# Patient Record
Sex: Male | Born: 1935 | Race: White | Hispanic: No | Marital: Married | Smoking: Former smoker
Health system: Southern US, Community
[De-identification: ages and names within clinical notes are randomized; demographics above are authoritative.]

## PROBLEM LIST (undated history)

## (undated) DIAGNOSIS — I509 Heart failure, unspecified: Secondary | ICD-10-CM

## (undated) DIAGNOSIS — I10 Essential (primary) hypertension: Secondary | ICD-10-CM

## (undated) DIAGNOSIS — I2510 Atherosclerotic heart disease of native coronary artery without angina pectoris: Secondary | ICD-10-CM

## (undated) DIAGNOSIS — E119 Type 2 diabetes mellitus without complications: Secondary | ICD-10-CM

## (undated) DIAGNOSIS — E785 Hyperlipidemia, unspecified: Secondary | ICD-10-CM

## (undated) DIAGNOSIS — I4891 Unspecified atrial fibrillation: Secondary | ICD-10-CM

## (undated) DIAGNOSIS — K219 Gastro-esophageal reflux disease without esophagitis: Secondary | ICD-10-CM

## (undated) DIAGNOSIS — C801 Malignant (primary) neoplasm, unspecified: Secondary | ICD-10-CM

## (undated) DIAGNOSIS — J449 Chronic obstructive pulmonary disease, unspecified: Secondary | ICD-10-CM

## (undated) HISTORY — PX: PROSTATE SURGERY: SHX751

## (undated) HISTORY — DX: Unspecified atrial fibrillation: I48.91

## (undated) HISTORY — PX: STENT PLACEMENT VASCULAR (ARMC HX): HXRAD1737

## (undated) HISTORY — DX: Heart failure, unspecified: I50.9

## (undated) HISTORY — PX: TONSILLECTOMY: SUR1361

---

## 2005-06-11 ENCOUNTER — Ambulatory Visit: Payer: Self-pay | Admitting: Unknown Physician Specialty

## 2010-02-25 DIAGNOSIS — D72829 Elevated white blood cell count, unspecified: Secondary | ICD-10-CM | POA: Insufficient documentation

## 2011-09-25 ENCOUNTER — Ambulatory Visit: Payer: Self-pay | Admitting: Urology

## 2012-12-02 ENCOUNTER — Ambulatory Visit: Payer: Self-pay | Admitting: Ophthalmology

## 2014-03-09 DIAGNOSIS — I10 Essential (primary) hypertension: Secondary | ICD-10-CM | POA: Insufficient documentation

## 2014-03-09 DIAGNOSIS — E785 Hyperlipidemia, unspecified: Secondary | ICD-10-CM | POA: Insufficient documentation

## 2014-09-14 DIAGNOSIS — Z8679 Personal history of other diseases of the circulatory system: Secondary | ICD-10-CM | POA: Insufficient documentation

## 2014-09-14 DIAGNOSIS — Z8711 Personal history of peptic ulcer disease: Secondary | ICD-10-CM | POA: Insufficient documentation

## 2016-05-19 ENCOUNTER — Emergency Department: Payer: Medicare Other

## 2016-05-19 ENCOUNTER — Inpatient Hospital Stay
Admission: EM | Admit: 2016-05-19 | Discharge: 2016-05-23 | DRG: 190 | Disposition: A | Discharge status: Home Health | Payer: Medicare Other | Attending: Internal Medicine | Admitting: Internal Medicine

## 2016-05-19 DIAGNOSIS — I2510 Atherosclerotic heart disease of native coronary artery without angina pectoris: Secondary | ICD-10-CM | POA: Diagnosis present

## 2016-05-19 DIAGNOSIS — J440 Chronic obstructive pulmonary disease with acute lower respiratory infection: Secondary | ICD-10-CM | POA: Diagnosis present

## 2016-05-19 DIAGNOSIS — I472 Ventricular tachycardia, unspecified: Secondary | ICD-10-CM

## 2016-05-19 DIAGNOSIS — Z79899 Other long term (current) drug therapy: Secondary | ICD-10-CM

## 2016-05-19 DIAGNOSIS — T380X5A Adverse effect of glucocorticoids and synthetic analogues, initial encounter: Secondary | ICD-10-CM | POA: Diagnosis present

## 2016-05-19 DIAGNOSIS — I493 Ventricular premature depolarization: Secondary | ICD-10-CM | POA: Diagnosis present

## 2016-05-19 DIAGNOSIS — Z7984 Long term (current) use of oral hypoglycemic drugs: Secondary | ICD-10-CM | POA: Diagnosis not present

## 2016-05-19 DIAGNOSIS — N289 Disorder of kidney and ureter, unspecified: Secondary | ICD-10-CM | POA: Diagnosis present

## 2016-05-19 DIAGNOSIS — E785 Hyperlipidemia, unspecified: Secondary | ICD-10-CM | POA: Diagnosis present

## 2016-05-19 DIAGNOSIS — Z7982 Long term (current) use of aspirin: Secondary | ICD-10-CM

## 2016-05-19 DIAGNOSIS — J9621 Acute and chronic respiratory failure with hypoxia: Secondary | ICD-10-CM | POA: Diagnosis present

## 2016-05-19 DIAGNOSIS — A419 Sepsis, unspecified organism: Secondary | ICD-10-CM

## 2016-05-19 DIAGNOSIS — J189 Pneumonia, unspecified organism: Secondary | ICD-10-CM | POA: Diagnosis present

## 2016-05-19 DIAGNOSIS — I10 Essential (primary) hypertension: Secondary | ICD-10-CM | POA: Diagnosis present

## 2016-05-19 DIAGNOSIS — J441 Chronic obstructive pulmonary disease with (acute) exacerbation: Secondary | ICD-10-CM | POA: Diagnosis present

## 2016-05-19 DIAGNOSIS — Z7901 Long term (current) use of anticoagulants: Secondary | ICD-10-CM

## 2016-05-19 DIAGNOSIS — E1165 Type 2 diabetes mellitus with hyperglycemia: Secondary | ICD-10-CM | POA: Diagnosis present

## 2016-05-19 DIAGNOSIS — I248 Other forms of acute ischemic heart disease: Secondary | ICD-10-CM | POA: Diagnosis present

## 2016-05-19 DIAGNOSIS — Z87891 Personal history of nicotine dependence: Secondary | ICD-10-CM | POA: Diagnosis not present

## 2016-05-19 DIAGNOSIS — I482 Chronic atrial fibrillation: Secondary | ICD-10-CM | POA: Diagnosis present

## 2016-05-19 DIAGNOSIS — I4891 Unspecified atrial fibrillation: Secondary | ICD-10-CM

## 2016-05-19 DIAGNOSIS — E876 Hypokalemia: Secondary | ICD-10-CM | POA: Diagnosis present

## 2016-05-19 DIAGNOSIS — R000 Tachycardia, unspecified: Secondary | ICD-10-CM

## 2016-05-19 HISTORY — DX: Essential (primary) hypertension: I10

## 2016-05-19 HISTORY — DX: Type 2 diabetes mellitus without complications: E11.9

## 2016-05-19 HISTORY — DX: Atherosclerotic heart disease of native coronary artery without angina pectoris: I25.10

## 2016-05-19 HISTORY — DX: Hyperlipidemia, unspecified: E78.5

## 2016-05-19 HISTORY — DX: Malignant (primary) neoplasm, unspecified: C80.1

## 2016-05-19 LAB — BASIC METABOLIC PANEL WITH GFR
Anion gap: 8 (ref 5–15)
BUN: 27 mg/dL — ABNORMAL HIGH (ref 6–20)
CO2: 32 mmol/L (ref 22–32)
Calcium: 9 mg/dL (ref 8.9–10.3)
Chloride: 98 mmol/L — ABNORMAL LOW (ref 101–111)
Creatinine, Ser: 1.32 mg/dL — ABNORMAL HIGH (ref 0.61–1.24)
GFR calc Af Amer: 55 mL/min — ABNORMAL LOW (ref >60)
GFR calc non Af Amer: 47 mL/min — ABNORMAL LOW (ref >60)
Glucose, Bld: 322 mg/dL — ABNORMAL HIGH (ref 65–99)
Potassium: 3.4 mmol/L — ABNORMAL LOW (ref 3.5–5.1)
Sodium: 138 mmol/L (ref 135–145)

## 2016-05-19 LAB — TROPONIN I
TROPONIN I: 0.07 ng/mL — AB (ref <0.03)
Troponin I: 0.06 ng/mL (ref <0.03)

## 2016-05-19 LAB — CBC WITH DIFFERENTIAL/PLATELET
Basophils Absolute: 0 10*3/uL (ref 0–0.1)
Basophils Relative: 0 %
Eosinophils Absolute: 0.1 10*3/uL (ref 0–0.7)
Eosinophils Relative: 1 %
HEMATOCRIT: 43.6 % (ref 40.0–52.0)
HEMOGLOBIN: 15.4 g/dL (ref 13.0–18.0)
LYMPHS ABS: 1.2 10*3/uL (ref 1.0–3.6)
Lymphocytes Relative: 9 %
MCH: 31.1 pg (ref 26.0–34.0)
MCHC: 35.2 g/dL (ref 32.0–36.0)
MCV: 88.3 fL (ref 80.0–100.0)
MONOS PCT: 8 %
Monocytes Absolute: 1.1 10*3/uL — ABNORMAL HIGH (ref 0.2–1.0)
NEUTROS ABS: 11.1 10*3/uL — AB (ref 1.4–6.5)
NEUTROS PCT: 82 %
Platelets: 334 10*3/uL (ref 150–440)
RBC: 4.94 MIL/uL (ref 4.40–5.90)
RDW: 13.7 % (ref 11.5–14.5)
WBC: 13.6 10*3/uL — AB (ref 3.8–10.6)

## 2016-05-19 LAB — GLUCOSE, CAPILLARY
Glucose-Capillary: 426 mg/dL — ABNORMAL HIGH (ref 65–99)
Glucose-Capillary: 411 mg/dL — ABNORMAL HIGH (ref 65–99)

## 2016-05-19 LAB — PROTIME-INR
INR: 3.65
Prothrombin Time: 37.2 s — ABNORMAL HIGH (ref 11.4–15.2)

## 2016-05-19 LAB — LACTIC ACID, PLASMA: Lactic Acid, Venous: 1.8 mmol/L (ref 0.5–1.9)

## 2016-05-19 MED ORDER — LATANOPROST 0.005 % OP SOLN
1.0000 [drp] | Freq: Every day | OPHTHALMIC | Status: DC
Start: 2016-05-19 — End: 2016-05-23
  Administered 2016-05-19 – 2016-05-22 (×4): 1 [drp] via OPHTHALMIC
  Filled 2016-05-19 (×2): qty 2.5

## 2016-05-19 MED ORDER — PREDNISONE 20 MG PO TABS
40.0000 mg | ORAL_TABLET | Freq: Once | ORAL | Status: AC
  Administered 2016-05-19: 40 mg via ORAL
  Filled 2016-05-19: qty 2

## 2016-05-19 MED ORDER — DEXTROSE 5 % IV SOLN
1.0000 g | INTRAVENOUS | Status: DC
  Administered 2016-05-20 – 2016-05-23 (×4): 1 g via INTRAVENOUS
  Filled 2016-05-19 (×4): qty 10

## 2016-05-19 MED ORDER — METOPROLOL TARTRATE 25 MG PO TABS
25.0000 mg | ORAL_TABLET | Freq: Four times a day (QID) | ORAL | Status: DC
  Administered 2016-05-19 – 2016-05-23 (×14): 25 mg via ORAL
  Filled 2016-05-19 (×14): qty 1

## 2016-05-19 MED ORDER — INSULIN ASPART 100 UNIT/ML ~~LOC~~ SOLN
0.0000 [IU] | Freq: Three times a day (TID) | SUBCUTANEOUS | Status: DC
Start: 2016-05-20 — End: 2016-05-19

## 2016-05-19 MED ORDER — DEXTROSE 5 % IV SOLN
500.0000 mg | INTRAVENOUS | Status: DC
  Administered 2016-05-20 – 2016-05-22 (×3): 500 mg via INTRAVENOUS
  Filled 2016-05-19 (×3): qty 500

## 2016-05-19 MED ORDER — HYDROCHLOROTHIAZIDE 25 MG PO TABS
25.0000 mg | ORAL_TABLET | Freq: Every day | ORAL | Status: DC
  Administered 2016-05-20 – 2016-05-23 (×4): 25 mg via ORAL
  Filled 2016-05-19 (×4): qty 1

## 2016-05-19 MED ORDER — AMLODIPINE BESYLATE 5 MG PO TABS
5.0000 mg | ORAL_TABLET | Freq: Every day | ORAL | Status: DC
  Administered 2016-05-20 – 2016-05-23 (×4): 5 mg via ORAL
  Filled 2016-05-19 (×4): qty 1

## 2016-05-19 MED ORDER — INSULIN ASPART 100 UNIT/ML ~~LOC~~ SOLN
0.0000 [IU] | Freq: Three times a day (TID) | SUBCUTANEOUS | Status: DC
  Administered 2016-05-20 (×3): 20 [IU] via SUBCUTANEOUS
  Administered 2016-05-21 (×2): 11 [IU] via SUBCUTANEOUS
  Administered 2016-05-21: 7 [IU] via SUBCUTANEOUS
  Administered 2016-05-22: 3 [IU] via SUBCUTANEOUS
  Administered 2016-05-22 (×2): 11 [IU] via SUBCUTANEOUS
  Filled 2016-05-19 (×3): qty 11
  Filled 2016-05-19: qty 7
  Filled 2016-05-19 (×2): qty 20
  Filled 2016-05-19: qty 3
  Filled 2016-05-19: qty 20

## 2016-05-19 MED ORDER — ACETAMINOPHEN 325 MG PO TABS
650.0000 mg | ORAL_TABLET | Freq: Four times a day (QID) | ORAL | Status: DC | PRN
  Administered 2016-05-19: 650 mg via ORAL
  Filled 2016-05-19: qty 1

## 2016-05-19 MED ORDER — DEXTROSE 5 % IV SOLN: 1.0000 g | INTRAVENOUS | Status: DC

## 2016-05-19 MED ORDER — SODIUM CHLORIDE 0.9 % IV BOLUS (SEPSIS)
1000.0000 mL | Freq: Once | INTRAVENOUS | Status: AC
  Administered 2016-05-19: 1000 mL via INTRAVENOUS

## 2016-05-19 MED ORDER — METHYLPREDNISOLONE SODIUM SUCC 125 MG IJ SOLR
60.0000 mg | Freq: Four times a day (QID) | INTRAMUSCULAR | Status: DC
  Administered 2016-05-19 – 2016-05-20 (×3): 60 mg via INTRAVENOUS
  Filled 2016-05-19 (×3): qty 2

## 2016-05-19 MED ORDER — ALBUTEROL SULFATE (2.5 MG/3ML) 0.083% IN NEBU
2.5000 mg | INHALATION_SOLUTION | RESPIRATORY_TRACT | Status: DC | PRN
  Filled 2016-05-19: qty 3

## 2016-05-19 MED ORDER — ONDANSETRON HCL 4 MG/2ML IJ SOLN: 4.0000 mg | Freq: Four times a day (QID) | INTRAMUSCULAR | Status: DC | PRN

## 2016-05-19 MED ORDER — HYDROCOD POLST-CPM POLST ER 10-8 MG/5ML PO SUER: 5.0000 mL | Freq: Two times a day (BID) | ORAL | Status: DC | PRN

## 2016-05-19 MED ORDER — DEXTROSE 5 % IV SOLN
1.0000 g | Freq: Once | INTRAVENOUS | Status: AC
  Administered 2016-05-19: 1 g via INTRAVENOUS
  Filled 2016-05-19: qty 10

## 2016-05-19 MED ORDER — DOCUSATE SODIUM 100 MG PO CAPS
100.0000 mg | ORAL_CAPSULE | Freq: Two times a day (BID) | ORAL | Status: DC
  Administered 2016-05-19 – 2016-05-23 (×8): 100 mg via ORAL
  Filled 2016-05-19 (×8): qty 1

## 2016-05-19 MED ORDER — IPRATROPIUM-ALBUTEROL 0.5-2.5 (3) MG/3ML IN SOLN
3.0000 mL | Freq: Once | RESPIRATORY_TRACT | Status: AC
  Administered 2016-05-19: 3 mL via RESPIRATORY_TRACT
  Filled 2016-05-19: qty 3

## 2016-05-19 MED ORDER — IPRATROPIUM-ALBUTEROL 0.5-2.5 (3) MG/3ML IN SOLN
3.0000 mL | Freq: Four times a day (QID) | RESPIRATORY_TRACT | Status: DC
  Administered 2016-05-19 – 2016-05-22 (×10): 3 mL via RESPIRATORY_TRACT
  Filled 2016-05-19 (×11): qty 3

## 2016-05-19 MED ORDER — ATORVASTATIN CALCIUM 20 MG PO TABS
40.0000 mg | ORAL_TABLET | Freq: Every day | ORAL | Status: DC
  Administered 2016-05-20 – 2016-05-23 (×4): 40 mg via ORAL
  Filled 2016-05-19 (×4): qty 2

## 2016-05-19 MED ORDER — WARFARIN SODIUM 5 MG PO TABS: 5.0000 mg | ORAL_TABLET | Freq: Every evening | ORAL | Status: DC

## 2016-05-19 MED ORDER — ACETAMINOPHEN 650 MG RE SUPP: 650.0000 mg | Freq: Four times a day (QID) | RECTAL | Status: DC | PRN

## 2016-05-19 MED ORDER — INSULIN ASPART 100 UNIT/ML ~~LOC~~ SOLN
0.0000 [IU] | Freq: Every day | SUBCUTANEOUS | Status: DC
  Administered 2016-05-19: 7 [IU] via SUBCUTANEOUS
  Administered 2016-05-20: 3 [IU] via SUBCUTANEOUS
  Administered 2016-05-21: 2 [IU] via SUBCUTANEOUS
  Filled 2016-05-19: qty 3
  Filled 2016-05-19: qty 7
  Filled 2016-05-19: qty 3

## 2016-05-19 MED ORDER — DEXTROSE 5 % IV SOLN: 500.0000 mg | INTRAVENOUS | Status: DC

## 2016-05-19 MED ORDER — METFORMIN HCL 500 MG PO TABS
500.0000 mg | ORAL_TABLET | Freq: Two times a day (BID) | ORAL | Status: DC
  Administered 2016-05-20 – 2016-05-23 (×7): 500 mg via ORAL
  Filled 2016-05-19 (×7): qty 1

## 2016-05-19 MED ORDER — LORAZEPAM 2 MG/ML IJ SOLN: 0.5000 mg | INTRAMUSCULAR | Status: DC | PRN

## 2016-05-19 MED ORDER — BISACODYL 10 MG RE SUPP: 10.0000 mg | Freq: Every day | RECTAL | Status: DC | PRN

## 2016-05-19 MED ORDER — FAMOTIDINE IN NACL 20-0.9 MG/50ML-% IV SOLN
20.0000 mg | Freq: Every day | INTRAVENOUS | Status: DC
  Administered 2016-05-19: 20 mg via INTRAVENOUS
  Filled 2016-05-19: qty 50

## 2016-05-19 MED ORDER — POTASSIUM CHLORIDE IN NACL 20-0.9 MEQ/L-% IV SOLN
INTRAVENOUS | Status: DC
  Administered 2016-05-19: 23:00:00 via INTRAVENOUS
  Filled 2016-05-19 (×4): qty 1000

## 2016-05-19 MED ORDER — SODIUM CHLORIDE 0.9% FLUSH
3.0000 mL | Freq: Two times a day (BID) | INTRAVENOUS | Status: DC
  Administered 2016-05-19 – 2016-05-22 (×7): 3 mL via INTRAVENOUS

## 2016-05-19 MED ORDER — DIPHENHYDRAMINE HCL 25 MG PO CAPS
25.0000 mg | ORAL_CAPSULE | Freq: Every evening | ORAL | Status: DC | PRN
  Administered 2016-05-19: 25 mg via ORAL
  Filled 2016-05-19: qty 1

## 2016-05-19 MED ORDER — TRAZODONE HCL 50 MG PO TABS
50.0000 mg | ORAL_TABLET | Freq: Every day | ORAL | Status: DC
  Administered 2016-05-19 – 2016-05-22 (×4): 50 mg via ORAL
  Filled 2016-05-19 (×4): qty 1

## 2016-05-19 MED ORDER — ASPIRIN EC 81 MG PO TBEC
81.0000 mg | DELAYED_RELEASE_TABLET | Freq: Every day | ORAL | Status: DC
  Administered 2016-05-20 – 2016-05-23 (×4): 81 mg via ORAL
  Filled 2016-05-19 (×4): qty 1

## 2016-05-19 MED ORDER — ONDANSETRON HCL 4 MG PO TABS: 4.0000 mg | ORAL_TABLET | Freq: Four times a day (QID) | ORAL | Status: DC | PRN

## 2016-05-19 MED ORDER — DEXTROSE 5 % IV SOLN
500.0000 mg | Freq: Once | INTRAVENOUS | Status: AC
  Administered 2016-05-19: 500 mg via INTRAVENOUS
  Filled 2016-05-19: qty 500

## 2016-05-19 MED ORDER — NITROGLYCERIN 2 % TD OINT
1.0000 [in_us] | TOPICAL_OINTMENT | Freq: Four times a day (QID) | TRANSDERMAL | Status: DC
  Administered 2016-05-19 – 2016-05-20 (×2): 1 [in_us] via TOPICAL
  Filled 2016-05-19 (×3): qty 1

## 2016-05-19 MED ORDER — PANTOPRAZOLE SODIUM 40 MG PO TBEC
40.0000 mg | DELAYED_RELEASE_TABLET | Freq: Every day | ORAL | Status: DC
  Administered 2016-05-20 – 2016-05-23 (×5): 40 mg via ORAL
  Filled 2016-05-19 (×4): qty 1

## 2016-05-19 NOTE — ED Triage Notes (Signed)
Oxygen sats 88% on RA and HR 121

## 2016-05-19 NOTE — ED Provider Notes (Signed)
[facility] Emergency Department Provider Note   ____________________________________________   First MD Initiated Contact with Patient 05/19/16 1507     (approximate)  I have reviewed the triage vital signs and the nursing notes.   HISTORY  Chief Complaint Cough    HPI Alan Barrett is a 82 y.o. male and coughing for one week. Reports short of breath. No fever but feeling chilled. Occasionally wheezing. No history of COPD or emphysema.He  Went to urgent care, oxygen level is low at 87%. He received one nebulizer there with minimal improvement.  No pain. History of an empyema required surgical drainage about 5 years ago in [place].   Past Medical History:  Diagnosis Date  . Cancer ([place])   . Diabetes mellitus without complication ([place])   . Hyperlipemia   . Hypertension     There are no active problems to display for this patient.   Past Surgical History:  Procedure Laterality Date  . LUNG SURGERY    . STENT PLACEMENT VASCULAR ([place] HX)      Prior to Admission medications   Not on File    Allergies Review of patient's allergies indicates no known allergies.  No family history on file.  Social History Social History  Substance Use Topics  . Smoking status: Former Research scientist (life sciences)  . Smokeless tobacco: Never Used  . Alcohol use Not on file    Review of Systems Constitutional: No fever/ Eyes: No visual changes. ENT: No sore throat. Cardiovascular: Denies chest pain. Respiratory: See history of present illness Gastrointestinal: No abdominal pain.  No nausea, no vomiting.  No diarrhea.  No constipation. Genitourinary: Negative for dysuria. Musculoskeletal: Negative for back pain. Skin: Negative for rash. Neurological: Negative for headaches, focal weakness or numbness.  10-point ROS otherwise negative.  ____________________________________________   PHYSICAL EXAM:  VITAL SIGNS: ED Triage Vitals  Enc Vitals Group     BP  05/19/16 1411 (!) 153/64     Pulse Rate 05/19/16 1411 (!) 120     Resp 05/19/16 1411 20     Temp 05/19/16 1411 98.1 F (36.7 C)     Temp Source 05/19/16 1411 Oral     SpO2 05/19/16 1411 (!) 89 %     Weight 05/19/16 1411 200 lb (90.7 kg)     Height 05/19/16 1411 5\' 8"  (1.727 m)     Head Circumference --      Peak Flow --      Pain Score 05/19/16 1412 0     Pain Loc --      Pain Edu? --      Excl. in [place]? --     Constitutional: Alert and oriented. Well appearing and in no acute distress.Mildly dyspneic Eyes: Conjunctivae are normal. PERRL. EOMI. Head: Atraumatic. Nose: No congestion/rhinnorhea. Mouth/Throat: Mucous membranes are moist.  Oropharynx non-erythematous. Neck: No stridor.   Cardiovascular: Tachycardic rate, regular rhythm. Grossly normal heart sounds.  Good peripheral circulation. Respiratory: Mild tachypnea. Mild and expiratory wheezing. Minimal focal rails versus dry crackles for the left lung base. Patient speaks in moderate phrases. Gastrointestinal: Soft and nontender. No distention. No abdominal bruits. No CVA tenderness. Musculoskeletal: No lower extremity tenderness nor edema.  No joint effusions. Neurologic:  Normal speech and language. No gross focal neurologic deficits are appreciated. No gait instability. Skin:  Skin is warm, dry and intact. No rash noted. Psychiatric: Mood and affect are normal. Speech and behavior are normal.  ____________________________________________   LABS (all labs ordered are listed, but  only abnormal results are displayed)  Labs Reviewed  CBC WITH DIFFERENTIAL/PLATELET - Abnormal; Notable for the following:       Result Value   WBC 13.6 (*)    Neutro Abs 11.1 (*)    Monocytes Absolute 1.1 (*)    All other components within normal limits  BASIC METABOLIC PANEL - Abnormal; Notable for the following:    Potassium 3.4 (*)    Chloride 98 (*)    Glucose, Bld 322 (*)    BUN 27 (*)    Creatinine, Ser 1.32 (*)    GFR calc non Af  Amer 47 (*)    GFR calc Af Amer 55 (*)    All other components within normal limits  TROPONIN I - Abnormal; Notable for the following:    Troponin I 0.07 (*)    All other components within normal limits  CULTURE, BLOOD (ROUTINE X 2)  CULTURE, BLOOD (ROUTINE X 2)  URINE CULTURE  LACTIC ACID, PLASMA  LACTIC ACID, PLASMA  PROTIME-INR   ____________________________________________  EKG  Reviewed her on the 1425 Heart rate 110 QRS 9 QTc 4:30 Normal sinus rhythm felt likely, but occasional ectopic atrial beats may be present, left axis deviation No evidence of acute ST elevation MI ____________________________________________  RADIOLOGY  Dg Chest 2 View  Result Date: 05/19/2016 CLINICAL DATA:  82 year old male with shortness of breath and 1 week history of cough EXAM: CHEST  2 VIEW COMPARISON:  None. FINDINGS: Patchy lingular opacity partially obscuring the left heart margin on the frontal view. No pleural effusion, pulmonary edema or pneumothorax. The cardiac and mediastinal contours are within normal limits. Diffuse bronchial wall thickening and mild interstitial prominence are likely chronic. No acute osseous abnormality. IMPRESSION: 1. Nonspecific patchy airspace opacity in the lingula may reflect early bronchopneumonia or a region of atelectasis/scarring. Followup PA and lateral chest X-ray is recommended in 3-4 weeks following trial of antibiotic therapy to ensure resolution and exclude underlying malignancy. 2.  Aortic Atherosclerosis (ICD10-170.0) 3. Mild hyperinflation, interstitial prominence and central airway thickening suggests underlying COPD. Electronically Signed   By: Jacqulynn Cadet M.D.   On: 05/19/2016 14:59    ____________________________________________   PROCEDURES  Procedure(s) performed: None  Procedures  Critical Care performed: Yes, see critical care note(s)  ____________________________________________   INITIAL IMPRESSION / ASSESSMENT AND PLAN /  ED COURSE  Pertinent labs & imaging results that were available during my care of the patient were reviewed by me and considered in my medical decision making (see chart for details).  Patient presents with one-week cough come wheezing, no previous history of COPD or emphysema. Appears likely bronchitis, however on chest x-ray there is questionable infiltrate in the lingula and given his ongoing cough with associated tachycardia and leukocytosis felt possible mild sepsis with associated pneumonia. He does a history of a previous empyema with left-sided chest tube placement, this could represent scarring as well.  Because of the patient's hemodynamic status, mild persistent tachycardia, landing to admit him to the hospital for broad-spectrum antibiotics and further care via internal medicine service. No evidence of acute coronary syndrome.  Clinical Course     ____________________________________________   FINAL CLINICAL IMPRESSION(S) / ED DIAGNOSES  Final diagnoses:  Sepsis, due to unspecified organism [facility])  Community acquired pneumonia      NEW MEDICATIONS STARTED DURING THIS VISIT:  New Prescriptions   No medications on file     Note:  This document was prepared using Dragon voice recognition software and may include  unintentional dictation errors.     Delman Kitten, MD 05/19/16 [phone]

## 2016-05-19 NOTE — H&P (Signed)
History and Physical    Alan Barrett [id number] DOB: [birth year] DOA: 05/19/2016  Referring physician: Dr. Jacqualine Code PCP: Madelyn Brunner, MD  Specialists: none  Chief Complaint: SOB and cough  HPI: Alan Barrett is a 82 y.o. male has a past medical history significant for ASCVD, chronic a-fib on coumadin and DM now with 1 week hx of worsening SOB and cough. No fever. Denies CP. Cough is non-productive. Hypoxic in ER. CXR reveals left lingular pneumonia. In rapid A-fib and troponin slightly elevated. He is now admitted.  Review of Systems: The patient denies anorexia, fever, weight loss,, vision loss, decreased hearing, hoarseness, chest pain, syncope,  peripheral edema, balance deficits, hemoptysis, abdominal pain, melena, hematochezia, severe indigestion/heartburn, hematuria, incontinence, genital sores, muscle weakness, suspicious skin lesions, transient blindness, difficulty walking, depression, unusual weight change, abnormal bleeding, enlarged lymph nodes, angioedema, and breast masses.   Past Medical History:  Diagnosis Date  . Cancer ([place])   . Coronary artery disease   . Diabetes mellitus without complication ([place])   . Hyperlipemia   . Hypertension    Past Surgical History:  Procedure Laterality Date  . LUNG SURGERY    . STENT PLACEMENT VASCULAR ([place] HX)     Social History:  reports that he has quit smoking. He has never used smokeless tobacco. His alcohol and drug histories are not on file.  No Known Allergies  History reviewed. No pertinent family history.  Prior to Admission medications   Medication Sig Start Date End Date Taking? Authorizing Provider  amLODipine (NORVASC) 5 MG tablet Take 5 mg by mouth daily. 06/30/15  Yes Historical Provider, MD  atorvastatin (LIPITOR) 40 MG tablet Take 40 mg by mouth daily. 12/18/15  Yes Historical Provider, MD  hydrochlorothiazide (HYDRODIURIL) 25 MG tablet Take 25 mg by mouth daily. 06/30/15  Yes Historical Provider, MD   metFORMIN (GLUCOPHAGE) 500 MG tablet Take 500 mg by mouth 2 (two) times daily. 06/30/15  Yes Historical Provider, MD  metoprolol tartrate (LOPRESSOR) 25 MG tablet Take 25 mg by mouth 2 (two) times daily. 06/30/15  Yes Historical Provider, MD  omeprazole (PRILOSEC) 20 MG capsule Take 20 mg by mouth daily. 12/18/15  Yes Historical Provider, MD  warfarin (COUMADIN) 5 MG tablet Take 5 mg by mouth every evening. 10/31/15  Yes Historical Provider, MD  aspirin EC 81 MG tablet Take 81 mg by mouth daily.    Historical Provider, MD  latanoprost (XALATAN) 0.005 % ophthalmic solution Place 1 drop into both eyes at bedtime. 03/21/16   Historical Provider, MD  PROAIR HFA 108 (90 Base) MCG/ACT inhaler Take 2 puffs by mouth every 4 (four) hours as needed. 05/15/16   Historical Provider, MD   Physical Exam: Vitals:   05/19/16 1411 05/19/16 1412 05/19/16 1505  BP: (!) 153/64  (!) 155/81  Pulse: (!) 120  (!) 108  Resp: 20  (!) 26  Temp: 98.1 F (36.7 C)    TempSrc: Oral    SpO2: (!) 89%  92%  Weight: 90.7 kg (200 lb)    Height: 5\' 8"  (1.727 m) 5\' 8"  (1.727 m)      General:  No apparent distress, WDWN, [place]/AT  Eyes: PERRL, EOMI, no scleral icterus, conjunctiva clear  ENT: moist oropharynx without exudate, TM's benign, dentition fair  Neck: supple, no lymphadenopathy. No bruits or thyromegaly  Cardiovascular: irregularly irregular with rapid rate without MRG; 2+ peripheral pulses, no JVD, no peripheral edema  Respiratory: left-sided rhonchi without wheezes or rales. Respiratory effort  increased  Abdomen: soft, non tender to palpation, positive bowel sounds, no guarding, no rebound  Skin: no rashes or lesions  Musculoskeletal: normal bulk and tone, no joint swelling  Psychiatric: normal mood and affect, A&OX3  Neurologic: CN 2-12 grossly intact, Motor strength 5/5 in all 4 groups with symmetric DTR's and non-focal sensory exam  Labs on Admission:  Basic Metabolic Panel:  Recent Labs Lab  05/19/16 1427  NA 138  K 3.4*  CL 98*  CO2 32  GLUCOSE 322*  BUN 27*  CREATININE 1.32*  CALCIUM 9.0   Liver Function Tests: No results for input(s): AST, ALT, ALKPHOS, BILITOT, PROT, ALBUMIN in the last 168 hours. No results for input(s): LIPASE, AMYLASE in the last 168 hours. No results for input(s): AMMONIA in the last 168 hours. CBC:  Recent Labs Lab 05/19/16 1427  WBC 13.6*  NEUTROABS 11.1*  HGB 15.4  HCT 43.6  MCV 88.3  PLT 334   Cardiac Enzymes:  Recent Labs Lab 05/19/16 1427  TROPONINI 0.07*    BNP (last 3 results) No results for input(s): BNP in the last 8760 hours.  ProBNP (last 3 results) No results for input(s): PROBNP in the last 8760 hours.  CBG: No results for input(s): GLUCAP in the last 168 hours.  Radiological Exams on Admission: Dg Chest 2 View  Result Date: 05/19/2016 CLINICAL DATA:  82 year old male with shortness of breath and 1 week history of cough EXAM: CHEST  2 VIEW COMPARISON:  None. FINDINGS: Patchy lingular opacity partially obscuring the left heart margin on the frontal view. No pleural effusion, pulmonary edema or pneumothorax. The cardiac and mediastinal contours are within normal limits. Diffuse bronchial wall thickening and mild interstitial prominence are likely chronic. No acute osseous abnormality. IMPRESSION: 1. Nonspecific patchy airspace opacity in the lingula may reflect early bronchopneumonia or a region of atelectasis/scarring. Followup PA and lateral chest X-ray is recommended in 3-4 weeks following trial of antibiotic therapy to ensure resolution and exclude underlying malignancy. 2.  Aortic Atherosclerosis (ICD10-170.0) 3. Mild hyperinflation, interstitial prominence and central airway thickening suggests underlying COPD. Electronically Signed   By: Jacqulynn Cadet M.D.   On: 05/19/2016 14:59    EKG: Independently reviewed.  Assessment/Plan Principal Problem:   Acute respiratory failure (HCC) Active Problems:   CAP  (community acquired pneumonia)   Tachycardia   Hypokalemia   Will admit to telemetry with IV fluids and IV ABX with O2 and SVN's. Follow enzymes. Order echo and Cardiology consult. Continue coumadin. Repeat CXR in AM. Optimize cardiac meds as well  Diet: heart healthy Fluids: NS with K+ DVT Prophylaxis: coumadin  Code Status: FULL  Family Communication: yes  Disposition Plan: home  Time spent: 50 min

## 2016-05-19 NOTE — ED Notes (Signed)
Patient transported to X-ray 

## 2016-05-19 NOTE — Progress Notes (Addendum)
Pt blood sugar 426. There are no insulin orders at this time. MD paged, awaiting call back.   Update 2207: Md Hugelmeyer notified. MD to place orders.   Update 2248: Per sliding scale orders, MD has to be notified off blood sugar over 400, pt blood sugar now 411. Verbal order from MD Hugelemeyer to give 7 units of novo log at bedtime. Snack provided at bedside. Will continue to monitor.   Iran Sizer M

## 2016-05-19 NOTE — ED Triage Notes (Signed)
Pt reports to ED w/ c/o cough x 1 week and low O2 sats.  Pt denies SOB, CP, fever, n/v/d or falls.  Pt reports going to Urgent Care and given nebulizer.  Pt sounds hoarse., breathing out of mouth. Alert and oriented.

## 2016-05-19 NOTE — Progress Notes (Signed)
ANTICOAGULATION CONSULT NOTE - Initial Consult  Pharmacy Consult for Warfarin  Indication: atrial fibrillation  No Known Allergies  Patient Measurements: Height: 5\' 8"  (172.7 cm) Weight: 200 lb (90.7 kg) IBW/kg (Calculated) : 68.4 Heparin Dosing Weight:   Vital Signs: Temp: 98.2 F (36.8 C) (09/17 1859) Temp Source: Oral (09/17 1859) BP: 157/68 (09/17 1859) Pulse Rate: 98 (09/17 1859)  Labs:  Recent Labs  05/19/16 1427  HGB 15.4  HCT 43.6  PLT 334  LABPROT 37.2*  INR 3.65  CREATININE 1.32*  TROPONINI 0.07*    Estimated Creatinine Clearance: 44.7 mL/min (by C-G formula based on SCr of 1.32 mg/dL (H)).   Medical History: Past Medical History:  Diagnosis Date  . Cancer ([place])   . Coronary artery disease   . Diabetes mellitus without complication ([place])   . Hyperlipemia   . Hypertension     Medications:  Prescriptions Prior to Admission  Medication Sig Dispense Refill Last Dose  . amLODipine (NORVASC) 5 MG tablet Take 5 mg by mouth daily.   05/19/2016 at 0800  . aspirin EC 81 MG tablet Take 81 mg by mouth daily.   05/19/2016 at 0800  . atorvastatin (LIPITOR) 40 MG tablet Take 40 mg by mouth daily.   05/19/2016 at 0800  . diphenhydramine-acetaminophen (TYLENOL PM) 25-500 MG TABS tablet Take 1 tablet by mouth at bedtime as needed.   05/18/2016 at pm  . hydrochlorothiazide (HYDRODIURIL) 12.5 MG tablet Take 25 mg by mouth daily.   05/19/2016 at 0800  . ketoconazole (NIZORAL) 2 % shampoo Apply 1 application topically 3 (three) times a week.   Past Week at Unknown time  . latanoprost (XALATAN) 0.005 % ophthalmic solution Place 1 drop into both eyes at bedtime.   05/18/2016 at Unknown time  . metFORMIN (GLUCOPHAGE) 500 MG tablet Take 500 mg by mouth 2 (two) times daily.   05/19/2016 at 0800  . metoprolol tartrate (LOPRESSOR) 25 MG tablet Take 25 mg by mouth 2 (two) times daily.   05/19/2016 at 0800  . omeprazole (PRILOSEC) 20 MG capsule Take 20 mg by mouth daily.   05/19/2016 at  0800  . PROAIR HFA 108 (90 Base) MCG/ACT inhaler Take 2 puffs by mouth every 4 (four) hours as needed.   prn at prn  . warfarin (COUMADIN) 5 MG tablet Take 5 mg by mouth every evening. Pt is currently on coumadin 5 mg on Saturday and Sunday and coumadin 7.5 mg all other days.   05/18/2016 at 1700    Assessment: 9/17 :  INR = 3.65  Goal of Therapy:  INR 2-3   Plan:  Will hold warfarin for 9/17 and recheck INR on 9/18 with AM labs.   Alan Barrett D 05/19/2016,7:22 PM

## 2016-05-20 ENCOUNTER — Inpatient Hospital Stay
Admit: 2016-05-20 | Discharge: 2016-05-20 | Disposition: A | Discharge status: Home / Self Care | Payer: Medicare Other | Attending: Internal Medicine | Admitting: Internal Medicine

## 2016-05-20 ENCOUNTER — Inpatient Hospital Stay: Payer: Medicare Other

## 2016-05-20 LAB — BLOOD CULTURE ID PANEL (REFLEXED)
Acinetobacter baumannii: NOT DETECTED
CANDIDA ALBICANS: NOT DETECTED
CANDIDA GLABRATA: NOT DETECTED
CANDIDA KRUSEI: NOT DETECTED
CANDIDA PARAPSILOSIS: NOT DETECTED
CANDIDA TROPICALIS: NOT DETECTED
ENTEROBACTER CLOACAE COMPLEX: NOT DETECTED
ENTEROBACTERIACEAE SPECIES: NOT DETECTED
Enterococcus species: NOT DETECTED
Escherichia coli: NOT DETECTED
Haemophilus influenzae: NOT DETECTED
KLEBSIELLA OXYTOCA: NOT DETECTED
KLEBSIELLA PNEUMONIAE: NOT DETECTED
Listeria monocytogenes: NOT DETECTED
Methicillin resistance: DETECTED — AB
Neisseria meningitidis: NOT DETECTED
Proteus species: NOT DETECTED
Pseudomonas aeruginosa: NOT DETECTED
STREPTOCOCCUS PYOGENES: NOT DETECTED
STREPTOCOCCUS SPECIES: NOT DETECTED
Serratia marcescens: NOT DETECTED
Staphylococcus aureus (BCID): NOT DETECTED
Staphylococcus species: DETECTED — AB
Streptococcus agalactiae: NOT DETECTED
Streptococcus pneumoniae: NOT DETECTED

## 2016-05-20 LAB — TROPONIN I
TROPONIN I: 0.03 ng/mL — AB (ref <0.03)
Troponin I: 0.05 ng/mL (ref <0.03)

## 2016-05-20 LAB — COMPREHENSIVE METABOLIC PANEL
ALT: 55 U/L (ref 17–63)
AST: 43 U/L — ABNORMAL HIGH (ref 15–41)
Albumin: 3 g/dL — ABNORMAL LOW (ref 3.5–5.0)
Alkaline Phosphatase: 85 U/L (ref 38–126)
Anion gap: 11 (ref 5–15)
BILIRUBIN TOTAL: 0.6 mg/dL (ref 0.3–1.2)
BUN: 29 mg/dL — ABNORMAL HIGH (ref 6–20)
CHLORIDE: 99 mmol/L — AB (ref 101–111)
CO2: 28 mmol/L (ref 22–32)
Calcium: 8.3 mg/dL — ABNORMAL LOW (ref 8.9–10.3)
Creatinine, Ser: 1.19 mg/dL (ref 0.61–1.24)
GFR, EST NON AFRICAN AMERICAN: 54 mL/min — AB (ref >60)
Glucose, Bld: 390 mg/dL — ABNORMAL HIGH (ref 65–99)
POTASSIUM: 3 mmol/L — AB (ref 3.5–5.1)
Sodium: 138 mmol/L (ref 135–145)
TOTAL PROTEIN: 7.6 g/dL (ref 6.5–8.1)

## 2016-05-20 LAB — ECHOCARDIOGRAM COMPLETE
Height: 68 in
WEIGHTICAEL: 3144 [oz_av]

## 2016-05-20 LAB — CBC
HEMATOCRIT: 40.9 % (ref 40.0–52.0)
Hemoglobin: 14.4 g/dL (ref 13.0–18.0)
MCH: 31 pg (ref 26.0–34.0)
MCHC: 35.1 g/dL (ref 32.0–36.0)
MCV: 88.3 fL (ref 80.0–100.0)
PLATELETS: 329 10*3/uL (ref 150–440)
RBC: 4.63 MIL/uL (ref 4.40–5.90)
RDW: 13.6 % (ref 11.5–14.5)
WBC: 8.8 10*3/uL (ref 3.8–10.6)

## 2016-05-20 LAB — GLUCOSE, CAPILLARY
GLUCOSE-CAPILLARY: 356 mg/dL — AB (ref 65–99)
Glucose-Capillary: 374 mg/dL — ABNORMAL HIGH (ref 65–99)
Glucose-Capillary: 353 mg/dL — ABNORMAL HIGH (ref 65–99)
Glucose-Capillary: 273 mg/dL — ABNORMAL HIGH (ref 65–99)

## 2016-05-20 LAB — PROTIME-INR
INR: 3.16
PROTHROMBIN TIME: 33.1 s — AB (ref 11.4–15.2)

## 2016-05-20 MED ORDER — VANCOMYCIN HCL 10 G IV SOLR
1250.0000 mg | INTRAVENOUS | Status: DC
  Administered 2016-05-21 – 2016-05-22 (×3): 1250 mg via INTRAVENOUS
  Filled 2016-05-20 (×7): qty 1250

## 2016-05-20 MED ORDER — FAMOTIDINE 20 MG PO TABS
20.0000 mg | ORAL_TABLET | Freq: Every day | ORAL | Status: DC
  Administered 2016-05-20 – 2016-05-23 (×4): 20 mg via ORAL
  Filled 2016-05-20 (×4): qty 1

## 2016-05-20 MED ORDER — POTASSIUM CHLORIDE CRYS ER 20 MEQ PO TBCR
40.0000 meq | EXTENDED_RELEASE_TABLET | ORAL | Status: DC | PRN
  Administered 2016-05-20: 40 meq via ORAL
  Filled 2016-05-20: qty 2

## 2016-05-20 MED ORDER — INSULIN DETEMIR 100 UNIT/ML ~~LOC~~ SOLN
13.0000 [IU] | Freq: Every day | SUBCUTANEOUS | Status: DC
  Administered 2016-05-20 – 2016-05-21 (×2): 13 [IU] via SUBCUTANEOUS
  Filled 2016-05-20 (×3): qty 0.13

## 2016-05-20 MED ORDER — VANCOMYCIN HCL IN DEXTROSE 1-5 GM/200ML-% IV SOLN
1000.0000 mg | Freq: Once | INTRAVENOUS | Status: AC
  Administered 2016-05-20: 1000 mg via INTRAVENOUS
  Filled 2016-05-20: qty 200

## 2016-05-20 MED ORDER — INSULIN ASPART 100 UNIT/ML ~~LOC~~ SOLN
4.0000 [IU] | Freq: Three times a day (TID) | SUBCUTANEOUS | Status: DC
  Administered 2016-05-20 – 2016-05-22 (×6): 4 [IU] via SUBCUTANEOUS
  Filled 2016-05-20 (×5): qty 4

## 2016-05-20 MED ORDER — METHYLPREDNISOLONE SODIUM SUCC 40 MG IJ SOLR
40.0000 mg | Freq: Two times a day (BID) | INTRAMUSCULAR | Status: DC
  Administered 2016-05-20 – 2016-05-22 (×4): 40 mg via INTRAVENOUS
  Filled 2016-05-20 (×4): qty 1

## 2016-05-20 MED ORDER — FUROSEMIDE 10 MG/ML IJ SOLN
40.0000 mg | Freq: Once | INTRAMUSCULAR | Status: AC
  Administered 2016-05-20: 40 mg via INTRAVENOUS
  Filled 2016-05-20: qty 4

## 2016-05-20 NOTE — Progress Notes (Signed)
Pharmacy Antibiotic Note  Alan Barrett is a 82 y.o. male admitted on 05/19/2016 now with bacteremia.  Pharmacy has been consulted for vancomycin dosing.  Plan: Vancomycin 1 g IV x1 then Vancomycin 1250 mg IV every 18 hours.  Goal trough 15-20 mcg/mL. Stacked dosing. Vanc trough before 4th dose of regimen. SCr in AM to follow renal function. Follow up BCx.   Ke 0.041, half life 17 h, Vd 62.3 L  Height: 5\' 8"  (172.7 cm) Weight: 196 lb 8 oz (89.1 kg) IBW/kg (Calculated) : 68.4  Temp (24hrs), Avg:97.8 F (36.6 C), Min:97.5 F (36.4 C), Max:98.2 F (36.8 C)   Recent Labs Lab 05/19/16 1427 05/19/16 1528 05/20/16 0830  WBC 13.6*  --  8.8  CREATININE 1.32*  --  1.19  LATICACIDVEN  --  1.8  --     Estimated Creatinine Clearance: 49.2 mL/min (by C-G formula based on SCr of 1.19 mg/dL).    No Known Allergies  Antimicrobials this admission: CTX/azithro 9/17 >> vanc 9/18 >>  Dose adjustments this admission:  Microbiology results:  BCx: 1/2 GPC  UCx: sent   Thank you for allowing pharmacy to be a part of this patient's care.  Rocky Morel 05/20/2016 4:29 PM

## 2016-05-20 NOTE — Consult Note (Signed)
[place]  CARDIOLOGY CONSULT NOTE  Patient ID: Alan Barrett MRN: [id number] DOB/AGE: 1929/07/21 82 y.o.  Admit date: 05/19/2016 Referring Physician Dr. Sissy Hoff Primary Physician   Primary Cardiologist Dr. Saralyn Pilar Reason for Consultation tachycardia  HPI: Pt is an 82 yo male with history of ascvd, chronic afib anticoagulated with warfarin, dm who presented to the er with complaints of cough and sob. CXR showed left lingular pna. He was hypoxic. EKG showed afib with rvr with serum tropoinin of 0.05. Pulse ox was89%. He was mildly hypertensive in the er. He was admitted and placed on abx and iv fluids. He became sob this am and iv fluids were stopped. He also received lasix with good result. His afib rate improved after treating the hypoxia and starting on abx. Echo showed preserved lv function with no signficant valvular abnormality. He is feeling much beeter this afternoon.   Review of Systems  Constitutional: Positive for malaise/fatigue.  HENT: Negative.   Eyes: Negative.   Respiratory: Positive for cough and shortness of breath.   Cardiovascular: Negative.   Gastrointestinal: Negative.   Genitourinary: Negative.   Musculoskeletal: Negative.   Skin: Negative.   Neurological: Positive for weakness.  Endo/Heme/Allergies: Negative.   Psychiatric/Behavioral: Negative.     Past Medical History:  Diagnosis Date  . Cancer ([place])   . Coronary artery disease   . Diabetes mellitus without complication ([place])   . Hyperlipemia   . Hypertension     History reviewed. No pertinent family history.  Social History   Social History  . Marital status: Married    Spouse name: N/A  . Number of children: N/A  . Years of education: N/A   Occupational History  . Not on file.   Social History Main Topics  . Smoking status: Former Research scientist (life sciences)  . Smokeless tobacco: Never Used  . Alcohol use Not on file  . Drug use: Unknown  . Sexual activity: Not on  file   Other Topics Concern  . Not on file   Social History Narrative  . No narrative on file    Past Surgical History:  Procedure Laterality Date  . LUNG SURGERY    . STENT PLACEMENT VASCULAR ([place] HX)       Prescriptions Prior to Admission  Medication Sig Dispense Refill Last Dose  . amLODipine (NORVASC) 5 MG tablet Take 5 mg by mouth daily.   05/19/2016 at 0800  . aspirin EC 81 MG tablet Take 81 mg by mouth daily.   05/19/2016 at 0800  . atorvastatin (LIPITOR) 40 MG tablet Take 40 mg by mouth daily.   05/19/2016 at 0800  . diphenhydramine-acetaminophen (TYLENOL PM) 25-500 MG TABS tablet Take 1 tablet by mouth at bedtime as needed.   05/18/2016 at pm  . hydrochlorothiazide (HYDRODIURIL) 12.5 MG tablet Take 25 mg by mouth daily.   05/19/2016 at 0800  . ketoconazole (NIZORAL) 2 % shampoo Apply 1 application topically 3 (three) times a week.   Past Week at Unknown time  . latanoprost (XALATAN) 0.005 % ophthalmic solution Place 1 drop into both eyes at bedtime.   05/18/2016 at Unknown time  . metFORMIN (GLUCOPHAGE) 500 MG tablet Take 500 mg by mouth 2 (two) times daily.   05/19/2016 at 0800  . metoprolol tartrate (LOPRESSOR) 25 MG tablet Take 25 mg by mouth 2 (two) times daily.   05/19/2016 at 0800  . omeprazole (PRILOSEC) 20 MG capsule Take 20 mg by mouth daily.   05/19/2016 at  0800  . PROAIR HFA 108 (90 Base) MCG/ACT inhaler Take 2 puffs by mouth every 4 (four) hours as needed.   prn at prn  . warfarin (COUMADIN) 5 MG tablet Take 5 mg by mouth every evening. Pt is currently on coumadin 5 mg on Saturday and Sunday and coumadin 7.5 mg all other days.   05/18/2016 at 1700    Physical Exam: Blood pressure 130/69, pulse 87, temperature 97.5 F (36.4 C), temperature source Oral, resp. rate 18, height 5\' 8"  (1.727 m), weight 89.1 kg (196 lb 8 oz), SpO2 97 %.   Wt Readings from Last 1 Encounters:  05/20/16 89.1 kg (196 lb 8 oz)     General appearance: alert and cooperative Resp: clear to  auscultation bilaterally Chest wall: right sided chest wall tenderness, left sided chest wall tenderness Cardio: irregularly irregular rhythm GI: soft, non-tender; bowel sounds normal; no masses,  no organomegaly Extremities: extremities normal, atraumatic, no cyanosis or edema Neurologic: Grossly normal  Labs:   Lab Results  Component Value Date   WBC 8.8 05/20/2016   HGB 14.4 05/20/2016   HCT 40.9 05/20/2016   MCV 88.3 05/20/2016   PLT 329 05/20/2016    Recent Labs Lab 05/20/16 0830  NA 138  K 3.0*  CL 99*  CO2 28  BUN 29*  CREATININE 1.19  CALCIUM 8.3*  PROT 7.6  BILITOT 0.6  ALKPHOS 85  ALT 55  AST 43*  GLUCOSE 390*   Lab Results  Component Value Date   TROPONINI 0.05 ([place]) 05/20/2016      Radiology: left lingular pneumonia EKG: afib with rvr initially; afib with controlled vr now. No ischemia  ASSESSMENT AND PLAN:  82 yo male with history of chronic afib who was admitted with pna. Noted to have rapid vr which has improved after treatment of his pna.. Continue warfarin with inr goal of 2-3. Continue with metoprolol for rate control. Continue to treat pna. WIll follow with you.  Signed: Teodoro Spray MD, [facility] 05/20/2016, 5:02 PM

## 2016-05-20 NOTE — Progress Notes (Signed)
PHARMACY - PHYSICIAN COMMUNICATION CRITICAL VALUE ALERT - BLOOD CULTURE IDENTIFICATION (BCID)  Results for orders placed or performed during the hospital encounter of 05/19/16  Blood Culture ID Panel (Reflexed) (Collected: 05/19/2016  4:05 PM)  Result Value Ref Range   Enterococcus species NOT DETECTED NOT DETECTED   Listeria monocytogenes NOT DETECTED NOT DETECTED   Staphylococcus species DETECTED (A) NOT DETECTED   Staphylococcus aureus NOT DETECTED NOT DETECTED   Methicillin resistance DETECTED (A) NOT DETECTED   Streptococcus species NOT DETECTED NOT DETECTED   Streptococcus agalactiae NOT DETECTED NOT DETECTED   Streptococcus pneumoniae NOT DETECTED NOT DETECTED   Streptococcus pyogenes NOT DETECTED NOT DETECTED   Acinetobacter baumannii NOT DETECTED NOT DETECTED   Enterobacteriaceae species NOT DETECTED NOT DETECTED   Enterobacter cloacae complex NOT DETECTED NOT DETECTED   Escherichia coli NOT DETECTED NOT DETECTED   Klebsiella oxytoca NOT DETECTED NOT DETECTED   Klebsiella pneumoniae NOT DETECTED NOT DETECTED   Proteus species NOT DETECTED NOT DETECTED   Serratia marcescens NOT DETECTED NOT DETECTED   Haemophilus influenzae NOT DETECTED NOT DETECTED   Neisseria meningitidis NOT DETECTED NOT DETECTED   Pseudomonas aeruginosa NOT DETECTED NOT DETECTED   Candida albicans NOT DETECTED NOT DETECTED   Candida glabrata NOT DETECTED NOT DETECTED   Candida krusei NOT DETECTED NOT DETECTED   Candida parapsilosis NOT DETECTED NOT DETECTED   Candida tropicalis NOT DETECTED NOT DETECTED    Name of physician (or Provider) Contacted: Dr. Vianne Bulls  Changes to prescribed antibiotics required: Adding vancomycin for now until cx finalized  Rocky Morel 05/20/2016  2:56 PM

## 2016-05-20 NOTE — Progress Notes (Signed)
05/20/16 at [place] called to Clinical Pharmacist Rayna Sexton:  BCID called from lab 05/20/16 at 1428. BCID: 1 bottle (anaerobic) of 4: + Gram positive cocci, + Staph. Species, MecA +.  Chinita Greenland PharmD Clinical Pharmacist 05/20/2016

## 2016-05-20 NOTE — Progress Notes (Signed)
PHARMACIST - PHYSICIAN COMMUNICATION  CONCERNING: IV to Oral Route Change Policy  RECOMMENDATION: This patient is receiving famotidine by the intravenous route.  Based on criteria approved by the Pharmacy and Therapeutics Committee, the intravenous medication(s) is/are being converted to the equivalent oral dose form(s).   DESCRIPTION: These criteria include:  The patient is eating (either orally or via tube) and/or has been taking other orally administered medications for a least 24 hours  The patient has no evidence of active gastrointestinal bleeding or impaired GI absorption (gastrectomy, short bowel, patient on TNA or NPO).  If you have questions about this conversion, please contact the Pharmacy Department  []   [phone] )  [facility] [x]   [phone] )  [facility] []   [phone] )  [facility] []   [phone] )  [facility] []   [phone] )  [place], [facility] 05/20/2016 12:37 PM

## 2016-05-20 NOTE — Progress Notes (Signed)
ANTICOAGULATION CONSULT NOTE - Follow up [place] for Warfarin  Indication: atrial fibrillation  No Known Allergies  Patient Measurements: Height: 5\' 8"  (172.7 cm) Weight: 196 lb 8 oz (89.1 kg) IBW/kg (Calculated) : 68.4 Heparin Dosing Weight:   Vital Signs: Temp: 97.5 F (36.4 C) (09/18 1100) Temp Source: Oral (09/18 1100) BP: 130/69 (09/18 1100) Pulse Rate: 87 (09/18 1100)  Labs:  Recent Labs  05/19/16 1427 05/19/16 2003 05/20/16 0245 05/20/16 0830  HGB 15.4  --   --  14.4  HCT 43.6  --   --  40.9  PLT 334  --   --  329  LABPROT 37.2*  --   --  33.1*  INR 3.65  --   --  3.16  CREATININE 1.32*  --   --  1.19  TROPONINI 0.07* 0.06* 0.03* 0.05*    Estimated Creatinine Clearance: 49.2 mL/min (by C-G formula based on SCr of 1.19 mg/dL).   Medical History: Past Medical History:  Diagnosis Date  . Cancer ([place])   . Coronary artery disease   . Diabetes mellitus without complication ([place])   . Hyperlipemia   . Hypertension     Medications:  Prescriptions Prior to Admission  Medication Sig Dispense Refill Last Dose  . amLODipine (NORVASC) 5 MG tablet Take 5 mg by mouth daily.   05/19/2016 at 0800  . aspirin EC 81 MG tablet Take 81 mg by mouth daily.   05/19/2016 at 0800  . atorvastatin (LIPITOR) 40 MG tablet Take 40 mg by mouth daily.   05/19/2016 at 0800  . diphenhydramine-acetaminophen (TYLENOL PM) 25-500 MG TABS tablet Take 1 tablet by mouth at bedtime as needed.   05/18/2016 at pm  . hydrochlorothiazide (HYDRODIURIL) 12.5 MG tablet Take 25 mg by mouth daily.   05/19/2016 at 0800  . ketoconazole (NIZORAL) 2 % shampoo Apply 1 application topically 3 (three) times a week.   Past Week at Unknown time  . latanoprost (XALATAN) 0.005 % ophthalmic solution Place 1 drop into both eyes at bedtime.   05/18/2016 at Unknown time  . metFORMIN (GLUCOPHAGE) 500 MG tablet Take 500 mg by mouth 2 (two) times daily.   05/19/2016 at 0800  . metoprolol tartrate (LOPRESSOR)  25 MG tablet Take 25 mg by mouth 2 (two) times daily.   05/19/2016 at 0800  . omeprazole (PRILOSEC) 20 MG capsule Take 20 mg by mouth daily.   05/19/2016 at 0800  . PROAIR HFA 108 (90 Base) MCG/ACT inhaler Take 2 puffs by mouth every 4 (four) hours as needed.   prn at prn  . warfarin (COUMADIN) 5 MG tablet Take 5 mg by mouth every evening. Pt is currently on coumadin 5 mg on Saturday and Sunday and coumadin 7.5 mg all other days.   05/18/2016 at 1700    Assessment: 82 yo male on warfarin PTA for AFib. Notes in Care Everywhere state pt's home regimen to be 5 mg Sat/Sun and 7.5 mg all other days with INR in 1.8-2.2 range last two months.  Pt started on ceftriaxone and azithromycin this admission for PNA.   9/17 :  INR = 3.65 (on admission) - no warfarin 9/18:   INR = 3.16  Goal of Therapy:  INR 2-3   Plan:  INR still slightly elevated, started on antibiotics. Will hold warfarin today to ensure INR comes down between 2-3 and recheck INR tomorrow with AM labs.   Rocky Morel 05/20/2016,12:27 PM

## 2016-05-20 NOTE — Progress Notes (Signed)
Inpatient Diabetes Program Recommendations  AACE/ADA: New Consensus Statement on Inpatient Glycemic Control (2015)  Target Ranges:  Prepandial:   less than 140 mg/dL      Peak postprandial:   less than 180 mg/dL (1-2 hours)      Critically ill patients:  140 - 180 mg/dL   Results for GREGG, MCCATHERN (MRN [id number]) as of 05/20/2016 09:56  Ref. Range 05/19/2016 20:58 05/19/2016 22:18 05/20/2016 07:36  Glucose-Capillary Latest Ref Range: 65 - 99 mg/dL 426 (H) 411 (H) 374 (H)   Review of Glycemic Control  Diabetes history: DM2 Outpatient Diabetes medications: Metformin 500 mg BID Current orders for Inpatient glycemic control: Metformin 500 mg BID, Novolog 0-20 units TID with meals, Novolog 0-5 units QHS  Inpatient Diabetes Program Recommendations: Insulin - Basal: Fasting glucose 374 mg/dl this morning. If steroids are continued, please consider ordering Levemir 13 units Q24 (based on 89 kg x 0.15 units). Insulin - Meal Coverage: If post prandial glucose is consistently elevated and steroids are continued, please consider ordering Novolog 4 units TID with meals for meal coverage if patient eats at least 50% of meal. HgbA1C: Last A1C noted in Care Everywhere was 6.6% on 03/13/16. Please consider adding an A1C on to blood in lab or draw with next scheduled blood draw to evaluate glycemic control over the past 2-3 months.  NOTE: Patient is ordered Solumedrol 60 mg Q6H which is contributing to hyperglycemia.   Thanks, Barnie Alderman, RN, MSN, CDE Diabetes Coordinator Inpatient Diabetes Program [phone] (Team Pager from [place] to [place]) [phone] (AP office) [phone] [facility] office) [phone] [facility] office)

## 2016-05-20 NOTE — Progress Notes (Signed)
Patient developed SOB and SPO2 88. Lung sounds tight and expiratory wheezing. Breathing treatment given. SOB better lung sound course crackles throughout. NS with 20 of K at 100. This nurse stopped the fluids and notified Dr. Vianne Bulls who stated to discontinue fluids and give 40 of IV Lasix

## 2016-05-20 NOTE — Progress Notes (Signed)
*  PRELIMINARY RESULTS* Echocardiogram 2D Echocardiogram has been performed.  Alan Barrett 05/20/2016, 3:10 PM

## 2016-05-20 NOTE — Progress Notes (Signed)
[facility] at [place] NAME: Alan Barrett    MR#:  [id number]  DATE OF BIRTH:  1929/03/21  SUBJECTIVE: Patient had shortness of breath this morning improved with stopping the IV fluids, Lasix. Now he feels much better.   CHIEF COMPLAINT:   Chief Complaint  Patient presents with  . Cough    REVIEW OF SYSTEMS:   ROS CONSTITUTIONAL: No fever, fatigue or weakness.  EYES: No blurred or double vision.  EARS, NOSE, AND THROAT: No tinnitus or ear pain.  RESPIRATORY: Cough, shortness of breath, wheezing CARDIOVASCULAR: No chest pain, orthopnea, edema.  GASTROINTESTINAL: No nausea, vomiting, diarrhea or abdominal pain.  GENITOURINARY: No dysuria, hematuria.  ENDOCRINE: No polyuria, nocturia,  HEMATOLOGY: No anemia, easy bruising or bleeding SKIN: No rash or lesion. MUSCULOSKELETAL: No joint pain or arthritis.   NEUROLOGIC: No tingling, numbness, weakness.  PSYCHIATRY: No anxiety or depression.   DRUG ALLERGIES:  No Known Allergies  VITALS:  Blood pressure 130/69, pulse 87, temperature 97.5 F (36.4 C), temperature source Oral, resp. rate 18, height 5\' 8"  (1.727 m), weight 89.1 kg (196 lb 8 oz), SpO2 (!) 88 %.  PHYSICAL EXAMINATION:  GENERAL:  82 y.o.-year-old patient lying in the bed with no acute distress.  EYES: Pupils equal, round, reactive to light and accommodation. No scleral icterus. Extraocular muscles intact.  HEENT: Head atraumatic, normocephalic. Oropharynx and nasopharynx clear.  NECK:  Supple, no jugular venous distention. No thyroid enlargement, no tenderness.  LUNGS: Faint expiratory wheeze in all lung fields.no  rales,rhonchi or crepitation. No use of accessory muscles of respiration.  CARDIOVASCULAR: S1, S2 normal. No murmurs, rubs, or gallops.  ABDOMEN: Soft, nontender, nondistended. Bowel sounds present. No organomegaly or mass.  EXTREMITIES: No pedal edema, cyanosis, or clubbing.  NEUROLOGIC: Cranial nerves II  through XII are intact. Muscle strength 5/5 in all extremities. Sensation intact. Gait not checked.  PSYCHIATRIC: The patient is alert and oriented x 3.  SKIN: No obvious rash, lesion, or ulcer.    LABORATORY PANEL:   CBC  Recent Labs Lab 05/20/16 0830  WBC 8.8  HGB 14.4  HCT 40.9  PLT 329   ------------------------------------------------------------------------------------------------------------------  Chemistries   Recent Labs Lab 05/20/16 0830  NA 138  K 3.0*  CL 99*  CO2 28  GLUCOSE 390*  BUN 29*  CREATININE 1.19  CALCIUM 8.3*  AST 43*  ALT 55  ALKPHOS 85  BILITOT 0.6   ------------------------------------------------------------------------------------------------------------------  Cardiac Enzymes  Recent Labs Lab 05/20/16 0830  TROPONINI 0.05*   ------------------------------------------------------------------------------------------------------------------  RADIOLOGY:  X-ray Chest Pa And Lateral  Result Date: 05/20/2016 CLINICAL DATA:  Nonproductive cough, shortness of breath. Low O2 sats. EXAM: CHEST  2 VIEW COMPARISON:  05/19/2016 FINDINGS: Heart is normal size. There is hyperinflation of the lungs compatible with COPD. Continued patchy left lower lobe airspace opacity, slightly improved, likely with residual atelectasis or scarring. Linear atelectasis or scarring at the right base. No acute bony abnormality. IMPRESSION: Improving left basilar opacity with residual atelectasis or scarring. Right base atelectasis or scar. COPD. Electronically Signed   By: Rolm Baptise M.D.   On: 05/20/2016 09:17   Dg Chest 2 View  Result Date: 05/19/2016 CLINICAL DATA:  82 year old male with shortness of breath and 1 week history of cough EXAM: CHEST  2 VIEW COMPARISON:  None. FINDINGS: Patchy lingular opacity partially obscuring the left heart margin on the frontal view. No pleural effusion, pulmonary edema or pneumothorax. The cardiac and mediastinal contours are  within normal limits. Diffuse bronchial wall thickening and mild interstitial prominence are likely chronic. No acute osseous abnormality. IMPRESSION: 1. Nonspecific patchy airspace opacity in the lingula may reflect early bronchopneumonia or a region of atelectasis/scarring. Followup PA and lateral chest X-ray is recommended in 3-4 weeks following trial of antibiotic therapy to ensure resolution and exclude underlying malignancy. 2.  Aortic Atherosclerosis (ICD10-170.0) 3. Mild hyperinflation, interstitial prominence and central airway thickening suggests underlying COPD. Electronically Signed   By: Jacqulynn Cadet M.D.   On: 05/19/2016 14:59    EKG:   Orders placed or performed during the hospital encounter of 05/19/16  . ED EKG  . ED EKG    ASSESSMENT AND PLAN:   #1 acute respiratory failure with hypoxia secondary to pulmonary edema, pneumonia: Discontinued IV hydration, give a dose of Lasix. Patient also is on IV antibiotics with Rocephin, Zithromax. Continued with nebs every 6 hours. Respiratory status much better after giving the Lasix and discontinue the IV fluids. #2 diabetes mellitus type 2: Uncontrolled secondary to steroids. Check hemoglobin A1c, ordered NovoLog 4 units 3 times a day #3 chronic atrial fibrillation: Rate controlled. Continue Coumadin  Discussed  with the registered nurse, also discussed with patient.   All the records are reviewed and case discussed with Care Management/Social Workerr. Management plans discussed with the patient, family and they are in agreement.  CODE STATUS: full  TOTAL TIME TAKING CARE OF THIS PATIENT: 35 minutes.   POSSIBLE D/C IN 1-2 DAYS, DEPENDING ON CLINICAL CONDITION.   Epifanio Lesches M.D on 05/20/2016 at 1:05 PM  Between 7am to 6pm - Pager - [phone]  After 6pm go to [email or web address] - password [email or web address] [place] [facility]  Office  [phone]  CC: Primary care physician; Madelyn Brunner, MD   Note:  This dictation was prepared with Dragon dictation along with smaller phrase technology. Any transcriptional errors that result from this process are unintentional.

## 2016-05-21 LAB — BASIC METABOLIC PANEL
ANION GAP: 9 (ref 5–15)
BUN: 40 mg/dL — ABNORMAL HIGH (ref 6–20)
CHLORIDE: 100 mmol/L — AB (ref 101–111)
CO2: 28 mmol/L (ref 22–32)
CREATININE: 1.32 mg/dL — AB (ref 0.61–1.24)
Calcium: 8.2 mg/dL — ABNORMAL LOW (ref 8.9–10.3)
GFR calc non Af Amer: 47 mL/min — ABNORMAL LOW (ref >60)
GFR, EST AFRICAN AMERICAN: 55 mL/min — AB (ref >60)
GLUCOSE: 243 mg/dL — AB (ref 65–99)
Potassium: 2.9 mmol/L — ABNORMAL LOW (ref 3.5–5.1)
Sodium: 137 mmol/L (ref 135–145)

## 2016-05-21 LAB — HEMOGLOBIN A1C
Hgb A1c MFr Bld: 7.2 % — ABNORMAL HIGH (ref 4.8–5.6)
MEAN PLASMA GLUCOSE: 160 mg/dL

## 2016-05-21 LAB — CREATININE, SERUM
Creatinine, Ser: 1.26 mg/dL — ABNORMAL HIGH (ref 0.61–1.24)
GFR, EST AFRICAN AMERICAN: 58 mL/min — AB (ref >60)
GFR, EST NON AFRICAN AMERICAN: 50 mL/min — AB (ref >60)

## 2016-05-21 LAB — PROTIME-INR
INR: 3
PROTHROMBIN TIME: 31.8 s — AB (ref 11.4–15.2)

## 2016-05-21 LAB — GLUCOSE, CAPILLARY
GLUCOSE-CAPILLARY: 288 mg/dL — AB (ref 65–99)
GLUCOSE-CAPILLARY: 235 mg/dL — AB (ref 65–99)
GLUCOSE-CAPILLARY: 223 mg/dL — AB (ref 65–99)
Glucose-Capillary: 294 mg/dL — ABNORMAL HIGH (ref 65–99)

## 2016-05-21 LAB — POTASSIUM: Potassium: 3.3 mmol/L — ABNORMAL LOW (ref 3.5–5.1)

## 2016-05-21 LAB — MAGNESIUM: Magnesium: 2.1 mg/dL (ref 1.7–2.4)

## 2016-05-21 MED ORDER — INSULIN DETEMIR 100 UNIT/ML ~~LOC~~ SOLN
18.0000 [IU] | Freq: Every day | SUBCUTANEOUS | Status: DC
  Administered 2016-05-22 – 2016-05-23 (×2): 18 [IU] via SUBCUTANEOUS
  Filled 2016-05-21 (×2): qty 0.18

## 2016-05-21 MED ORDER — POTASSIUM CHLORIDE 10 MEQ/100ML IV SOLN
10.0000 meq | INTRAVENOUS | Status: AC
  Administered 2016-05-21 – 2016-05-22 (×4): 10 meq via INTRAVENOUS
  Filled 2016-05-21 (×4): qty 100

## 2016-05-21 MED ORDER — MAGNESIUM OXIDE 400 (241.3 MG) MG PO TABS
400.0000 mg | ORAL_TABLET | Freq: Every day | ORAL | Status: DC
  Administered 2016-05-21 – 2016-05-23 (×3): 400 mg via ORAL
  Filled 2016-05-21 (×3): qty 1

## 2016-05-21 NOTE — Progress Notes (Signed)
Per CCMD, patient having frequent PVCs- bigeminey at times, and 5-6 bt runs of vtach. Dr. Vianne Bulls and Dr. Ubaldo Glassing aware, Dr. Ubaldo Glassing to come see patient. Patient is asymptomatic at this time, will continue to monitor.

## 2016-05-21 NOTE — Progress Notes (Signed)
SATURATION QUALIFICATIONS: (This note is used to comply with regulatory documentation for home oxygen)  Patient Saturations on Room Air at Rest = 88%  Patient Saturations on Room Air while Ambulating = n/a%  Patient Saturations on n/a Liters of oxygen while Ambulating = n/a%  Please briefly explain why patient needs home oxygen: 

## 2016-05-21 NOTE — Care Management Important Message (Signed)
Important Message  Patient Details  Name: Alan Barrett MRN: [id number] Date of Birth: [birth year]   Medicare Important Message Given:  Yes    Katrina Stack, RN 05/21/2016, 4:08 PM

## 2016-05-21 NOTE — Care Management (Signed)
Patient is admitted with pneumonia. He is currently on 02 which is acute.  Discussed the need to assess for home 02 during progression.  Independent in all adls, denies issues accessing medical care, obtaining medications or with transportation.  Current with  PCP.  Patient is requesting a home nebulizer and nebulizer solution to use at home when his sx are exacerbated.  he does not feel his hand held inhalers work well when his sx are worse.  Review of medical record does not show evidence of chronic pulmonary issues but patient describes sx that sound chronic.  Left message for attending inquiring about presence of chronic cardiopulmonary issues.

## 2016-05-21 NOTE — Progress Notes (Signed)
[facility] at [place] NAME: Bennard Zafar    MR#:  [id number]  DATE OF BIRTH:  1928/10/01  SUBJECTIVE:he feels better today,less sob.having some pvc.asymptomatic.alzo hAD Vvtach  6beats.   CHIEF COMPLAINT:   Chief Complaint  Patient presents with  . Cough    REVIEW OF SYSTEMS:   ROS CONSTITUTIONAL: No fever, fatigue or weakness.  EYES: No blurred or double vision.  EARS, NOSE, AND THROAT: No tinnitus or ear pain.  RESPIRATORY: Cough, shortness of breath, wheezing CARDIOVASCULAR: No chest pain, orthopnea, edema.  GASTROINTESTINAL: No nausea, vomiting, diarrhea or abdominal pain.  GENITOURINARY: No dysuria, hematuria.  ENDOCRINE: No polyuria, nocturia,  HEMATOLOGY: No anemia, easy bruising or bleeding SKIN: No rash or lesion. MUSCULOSKELETAL: No joint pain or arthritis.   NEUROLOGIC: No tingling, numbness, weakness.  PSYCHIATRY: No anxiety or depression.   DRUG ALLERGIES:  No Known Allergies  VITALS:  Blood pressure 129/69, pulse 84, temperature 97.3 F (36.3 C), temperature source Oral, resp. rate 20, height 5\' 8"  (1.727 m), weight 89.2 kg (196 lb 9.6 oz), SpO2 92 %.  PHYSICAL EXAMINATION:  GENERAL:  82 y.o.-year-old patient lying in the bed with no acute distress.  EYES: Pupils equal, round, reactive to light and accommodation. No scleral icterus. Extraocular muscles intact.  HEENT: Head atraumatic, normocephalic. Oropharynx and nasopharynx clear.  NECK:  Supple, no jugular venous distention. No thyroid enlargement, no tenderness.  LUNGS: Faint expiratory wheeze in all lung fields.no  rales,rhonchi or crepitation. No use of accessory muscles of respiration.  CARDIOVASCULAR: S1, S2 normal. No murmurs, rubs, or gallops.  ABDOMEN: Soft, nontender, nondistended. Bowel sounds present. No organomegaly or mass.  EXTREMITIES: No pedal edema, cyanosis, or clubbing.  NEUROLOGIC: Cranial nerves II through XII are intact. Muscle  strength 5/5 in all extremities. Sensation intact. Gait not checked.  PSYCHIATRIC: The patient is alert and oriented x 3.  SKIN: No obvious rash, lesion, or ulcer.    LABORATORY PANEL:   CBC  Recent Labs Lab 05/20/16 0830  WBC 8.8  HGB 14.4  HCT 40.9  PLT 329   ------------------------------------------------------------------------------------------------------------------  Chemistries   Recent Labs Lab 05/20/16 0830  05/21/16 1604  NA 138  --  137  K 3.0*  --  2.9*  CL 99*  --  100*  CO2 28  --  28  GLUCOSE 390*  --  243*  BUN 29*  --  40*  CREATININE 1.19  < > 1.32*  CALCIUM 8.3*  --  8.2*  MG  --   --  2.1  AST 43*  --   --   ALT 55  --   --   ALKPHOS 85  --   --   BILITOT 0.6  --   --   < > = values in this interval not displayed. ------------------------------------------------------------------------------------------------------------------  Cardiac Enzymes  Recent Labs Lab 05/20/16 0830  TROPONINI 0.05*   ------------------------------------------------------------------------------------------------------------------  RADIOLOGY:  X-ray Chest Pa And Lateral  Result Date: 05/20/2016 CLINICAL DATA:  Nonproductive cough, shortness of breath. Low O2 sats. EXAM: CHEST  2 VIEW COMPARISON:  05/19/2016 FINDINGS: Heart is normal size. There is hyperinflation of the lungs compatible with COPD. Continued patchy left lower lobe airspace opacity, slightly improved, likely with residual atelectasis or scarring. Linear atelectasis or scarring at the right base. No acute bony abnormality. IMPRESSION: Improving left basilar opacity with residual atelectasis or scarring. Right base atelectasis or scar. COPD. Electronically Signed   By: Rolm Baptise  M.D.   On: 05/20/2016 09:17    EKG:   Orders placed or performed during the hospital encounter of 05/19/16  . ED EKG  . ED EKG    ASSESSMENT AND PLAN:   #1 sob due to copd and chf;linically improving Copd  excaerbation;clinically improving. diabetesellitus type 2: Uncontrolled secondary to steroids. Continue lantus,novolog #3 chronic atrial fibrillation: Rate controlled. Continue Coumadin 4.nsvt and \\vtach ;likley due to hypokalemia;replaced,recheck  Discussed  with the registered nurse, also discussed with patient.   All the records are reviewed and case discussed with Care Management/Social Workerr. Management plans discussed with the patient, family and they are in agreement.  CODE STATUS: full  TOTAL TIME TAKING CARE OF THIS PATIENT: 35 minutes.   POSSIBLE D/C IN 1-2 DAYS, DEPENDING ON CLINICAL CONDITION.   Epifanio Lesches M.D on 05/21/2016 at 10:30 PM  Between 7am to 6pm - Pager - [phone]  After 6pm go to [email or web address] - password [email or web address] [facility] [place] [facility]  Office  [phone]  CC: Primary care physician; Madelyn Brunner, MD   Note: This dictation was prepared with Dragon dictation along with smaller phrase technology. Any transcriptional errors that result from this process are unintentional.

## 2016-05-21 NOTE — Progress Notes (Signed)
Serum potassium still low at 2.9. Dr. Vianne Bulls text paged, orders received for IV replacement and recheck after infusions complete.

## 2016-05-21 NOTE — Progress Notes (Signed)
ANTICOAGULATION CONSULT NOTE - Follow up [facility] for Warfarin  Indication: atrial fibrillation  No Known Allergies  Patient Measurements: Height: 5\' 8"  (172.7 cm) Weight: 196 lb 9.6 oz (89.2 kg) IBW/kg (Calculated) : 68.4 Heparin Dosing Weight:   Vital Signs: Temp: 98.1 F (36.7 C) (09/19 1151) Temp Source: Oral (09/19 0410) BP: 119/67 (09/19 1151) Pulse Rate: 86 (09/19 1151)  Labs:  Recent Labs  05/19/16 1427 05/19/16 2003 05/20/16 0245 05/20/16 0830 05/21/16 0348  HGB 15.4  --   --  14.4  --   HCT 43.6  --   --  40.9  --   PLT 334  --   --  329  --   LABPROT 37.2*  --   --  33.1* 31.8*  INR 3.65  --   --  3.16 3.00  CREATININE 1.32*  --   --  1.19 1.26*  TROPONINI 0.07* 0.06* 0.03* 0.05*  --     Estimated Creatinine Clearance: 46.5 mL/min (by C-G formula based on SCr of 1.26 mg/dL (H)).   Medical History: Past Medical History:  Diagnosis Date  . Cancer ([place])   . Coronary artery disease   . Diabetes mellitus without complication ([place])   . Hyperlipemia   . Hypertension     Medications:  Prescriptions Prior to Admission  Medication Sig Dispense Refill Last Dose  . amLODipine (NORVASC) 5 MG tablet Take 5 mg by mouth daily.   05/19/2016 at 0800  . aspirin EC 81 MG tablet Take 81 mg by mouth daily.   05/19/2016 at 0800  . atorvastatin (LIPITOR) 40 MG tablet Take 40 mg by mouth daily.   05/19/2016 at 0800  . diphenhydramine-acetaminophen (TYLENOL PM) 25-500 MG TABS tablet Take 1 tablet by mouth at bedtime as needed.   05/18/2016 at pm  . hydrochlorothiazide (HYDRODIURIL) 12.5 MG tablet Take 25 mg by mouth daily.   05/19/2016 at 0800  . ketoconazole (NIZORAL) 2 % shampoo Apply 1 application topically 3 (three) times a week.   Past Week at Unknown time  . latanoprost (XALATAN) 0.005 % ophthalmic solution Place 1 drop into both eyes at bedtime.   05/18/2016 at Unknown time  . metFORMIN (GLUCOPHAGE) 500 MG tablet Take 500 mg by mouth 2 (two) times  daily.   05/19/2016 at 0800  . metoprolol tartrate (LOPRESSOR) 25 MG tablet Take 25 mg by mouth 2 (two) times daily.   05/19/2016 at 0800  . omeprazole (PRILOSEC) 20 MG capsule Take 20 mg by mouth daily.   05/19/2016 at 0800  . PROAIR HFA 108 (90 Base) MCG/ACT inhaler Take 2 puffs by mouth every 4 (four) hours as needed.   prn at prn  . warfarin (COUMADIN) 5 MG tablet Take 5 mg by mouth every evening. Pt is currently on coumadin 5 mg on Saturday and Sunday and coumadin 7.5 mg all other days.   05/18/2016 at 1700    Assessment: 82 yo male on warfarin PTA for AFib. Notes in Care Everywhere state pt's home regimen to be 5 mg Sat/Sun and 7.5 mg all other days with INR in 1.8-2.2 range last two months.  Pt started on ceftriaxone and azithromycin this admission for PNA.   9/17 :  INR = 3.65 (on admission) - no warfarin 9/18:   INR = 3.16, no warfarin 9/19:   INR = 3.00  Goal of Therapy:  INR 2-3   Plan:  INR decreased slightly to 3.0, started on antibiotics. Will hold warfarin today to  ensure INR comes down between 2-3 and recheck INR tomorrow with AM labs.   Paulina Fusi, PharmD, BCPS 05/21/2016 12:17 PM

## 2016-05-21 NOTE — Progress Notes (Signed)
Inpatient Diabetes Program Recommendations  AACE/ADA: New Consensus Statement on Inpatient Glycemic Control (2015)  Target Ranges:  Prepandial:   less than 140 mg/dL      Peak postprandial:   less than 180 mg/dL (1-2 hours)      Critically ill patients:  140 - 180 mg/dL   Lab Results  Component Value Date   GLUCAP 294 (H) 05/21/2016   HGBA1C 7.2 (H) 05/20/2016    Review of Glycemic Control  Results for MARK, GAVIA (MRN [id number]) as of 05/21/2016 14:24  Ref. Range 05/20/2016 07:36 05/20/2016 11:00 05/20/2016 17:18 05/20/2016 20:06 05/21/2016 07:59  Glucose-Capillary Latest Ref Range: 65 - 99 mg/dL 374 (H) 353 (H) 356 (H) 273 (H) 294 (H)   Diabetes history: DM2 Outpatient Diabetes medications: Metformin 500 mg BID  Current orders for Inpatient glycemic control: Metformin 500 mg BID, Novolog 0-20 units TID with meals, Novolog 0-5 units QHS, Levemir 13 units qday, Novolog 4 units tid  Inpatient Diabetes Program Recommendations: Insulin - Basal: Fasting glucose 294 mg/dl this morning. If steroids are continued, please consider ordering Levemir 18 units Q24 (based on 89 kg x 0.20 units).  Insulin - Meal Coverage: If post prandial glucose is consistently elevated and steroids are continued, please consider ordering Novolog 5 units TID with meals for meal coverage if patient eats at least 50% of meal.  NOTE: Patient is ordered Solumedrol 40 mg Q12H which is contributing to hyperglycemia.   Will follow along.  Gentry Fitz, RN, BA, MHA, CDE Diabetes Coordinator Inpatient Diabetes Program  [phone] (Team Pager) [phone] ([place]) 05/21/2016 2:28 PM

## 2016-05-21 NOTE — Progress Notes (Signed)
[place]  SUBJECTIVE: less sob. Ectopy noted on telemetry  Asymptomatic from them   Vitals:   05/21/16 0817 05/21/16 0836 05/21/16 1151 05/21/16 1151  BP:  (!) 131/52  119/67  Pulse:  92  86  Resp:    17  Temp:    98.1 F (36.7 C)  TempSrc:      SpO2: 94% 95% 95% 95%  Weight:      Height:        Intake/Output Summary (Last 24 hours) at 05/21/16 1340 Last data filed at 05/21/16 0939  Gross per 24 hour  Intake              970 ml  Output              550 ml  Net              420 ml    LABS: Basic Metabolic Panel:  Recent Labs  05/19/16 1427 05/20/16 0830 05/21/16 0348  NA 138 138  --   K 3.4* 3.0*  --   CL 98* 99*  --   CO2 32 28  --   GLUCOSE 322* 390*  --   BUN 27* 29*  --   CREATININE 1.32* 1.19 1.26*  CALCIUM 9.0 8.3*  --    Liver Function Tests:  Recent Labs  05/20/16 0830  AST 43*  ALT 55  ALKPHOS 85  BILITOT 0.6  PROT 7.6  ALBUMIN 3.0*   No results for input(s): LIPASE, AMYLASE in the last 72 hours. CBC:  Recent Labs  05/19/16 1427 05/20/16 0830  WBC 13.6* 8.8  NEUTROABS 11.1*  --   HGB 15.4 14.4  HCT 43.6 40.9  MCV 88.3 88.3  PLT 334 329   Cardiac Enzymes:  Recent Labs  05/19/16 2003 05/20/16 0245 05/20/16 0830  TROPONINI 0.06* 0.03* 0.05*   BNP: Invalid input(s): POCBNP D-Dimer: No results for input(s): DDIMER in the last 72 hours. Hemoglobin A1C:  Recent Labs  05/20/16 0830  HGBA1C 7.2*   Fasting Lipid Panel: No results for input(s): CHOL, HDL, LDLCALC, TRIG, CHOLHDL, LDLDIRECT in the last 72 hours. Thyroid Function Tests: No results for input(s): TSH, T4TOTAL, T3FREE, THYROIDAB in the last 72 hours.  Invalid input(s): FREET3 Anemia Panel: No results for input(s): VITAMINB12, FOLATE, FERRITIN, TIBC, IRON, RETICCTPCT in the last 72 hours.   Physical Exam: Blood pressure 119/67, pulse 86, temperature 98.1 F (36.7 C), resp. rate 17, height 5\' 8"  (1.727 m), weight 89.2 kg  (196 lb 9.6 oz), SpO2 95 %.   Wt Readings from Last 1 Encounters:  05/21/16 89.2 kg (196 lb 9.6 oz)     General appearance: alert and cooperative Back: negative, symmetric, no curvature. ROM normal. No CVA tenderness. Resp: clear to auscultation bilaterally Cardio: irregularly irregular rhythm GI: soft, non-tender; bowel sounds normal; no masses,  no organomegaly Extremities: extremities normal, atraumatic, no cyanosis or edema Neurologic: Grossly normal  TELEMETRY: Reviewed telemetry pt in afib with variable vr. Frequent wide complex beats with several couplets and 4-5 beata runs. abarant afib  Vs vt. Somewhat irregular suggesting abarant afib.   ASSESSMENT AND PLAN:  Principal Problem:   Acute respiratory failure (HCC)-slowing improving.  Active Problems:   CAP (community acquired pneumonia)   Tachycardia-afib with controlled rate. K was 3.0 yesterday . Pt is on replacement. Having wide complex ectopy. Abarant afib vs ventricular beats. WIll recheck potassium today and add magnesium oxylate 400 daily. Will follow  Hypokalemia-repleat.     Teodoro Spray., MD, [facility] 05/21/2016 1:40 PM

## 2016-05-22 LAB — CULTURE, BLOOD (ROUTINE X 2)

## 2016-05-22 LAB — BASIC METABOLIC PANEL
Anion gap: 5 (ref 5–15)
BUN: 42 mg/dL — ABNORMAL HIGH (ref 6–20)
CALCIUM: 8.1 mg/dL — AB (ref 8.9–10.3)
CO2: 30 mmol/L (ref 22–32)
CREATININE: 1.26 mg/dL — AB (ref 0.61–1.24)
Chloride: 103 mmol/L (ref 101–111)
GFR, EST AFRICAN AMERICAN: 58 mL/min — AB (ref >60)
GFR, EST NON AFRICAN AMERICAN: 50 mL/min — AB (ref >60)
GLUCOSE: 257 mg/dL — AB (ref 65–99)
Potassium: 3.7 mmol/L (ref 3.5–5.1)
Sodium: 138 mmol/L (ref 135–145)

## 2016-05-22 LAB — GLUCOSE, CAPILLARY
Glucose-Capillary: 260 mg/dL — ABNORMAL HIGH (ref 65–99)
Glucose-Capillary: 271 mg/dL — ABNORMAL HIGH (ref 65–99)
Glucose-Capillary: 150 mg/dL — ABNORMAL HIGH (ref 65–99)
Glucose-Capillary: 126 mg/dL — ABNORMAL HIGH (ref 65–99)

## 2016-05-22 LAB — PROTIME-INR
INR: 2.07
Prothrombin Time: 23.6 seconds — ABNORMAL HIGH (ref 11.4–15.2)

## 2016-05-22 MED ORDER — IPRATROPIUM-ALBUTEROL 0.5-2.5 (3) MG/3ML IN SOLN: 3.0000 mL | Freq: Four times a day (QID) | RESPIRATORY_TRACT | 1 refills | Status: DC

## 2016-05-22 MED ORDER — PREDNISONE 10 MG (21) PO TBPK: 10.0000 mg | ORAL_TABLET | Freq: Every day | ORAL | 0 refills | Status: DC

## 2016-05-22 MED ORDER — WARFARIN SODIUM 6 MG PO TABS
6.0000 mg | ORAL_TABLET | Freq: Once | ORAL | Status: AC
  Administered 2016-05-22: 6 mg via ORAL
  Filled 2016-05-22: qty 1

## 2016-05-22 MED ORDER — IPRATROPIUM-ALBUTEROL 0.5-2.5 (3) MG/3ML IN SOLN
3.0000 mL | Freq: Three times a day (TID) | RESPIRATORY_TRACT | Status: DC
  Administered 2016-05-22 – 2016-05-23 (×3): 3 mL via RESPIRATORY_TRACT
  Filled 2016-05-22 (×3): qty 3

## 2016-05-22 MED ORDER — POTASSIUM CHLORIDE CRYS ER 10 MEQ PO TBCR
30.0000 meq | EXTENDED_RELEASE_TABLET | Freq: Once | ORAL | Status: AC
  Administered 2016-05-22: 30 meq via ORAL
  Filled 2016-05-22: qty 1

## 2016-05-22 MED ORDER — AZITHROMYCIN 250 MG PO TABS
500.0000 mg | ORAL_TABLET | Freq: Every day | ORAL | Status: DC
  Administered 2016-05-23: 500 mg via ORAL
  Filled 2016-05-22: qty 2

## 2016-05-22 MED ORDER — WARFARIN - PHARMACIST DOSING INPATIENT
Freq: Every day | Status: DC
  Administered 2016-05-22: 19:00:00

## 2016-05-22 MED ORDER — PREDNISONE 20 MG PO TABS
30.0000 mg | ORAL_TABLET | Freq: Every day | ORAL | Status: DC
  Administered 2016-05-23: 30 mg via ORAL
  Filled 2016-05-22: qty 1

## 2016-05-22 MED ORDER — INSULIN ASPART 100 UNIT/ML ~~LOC~~ SOLN
7.0000 [IU] | Freq: Three times a day (TID) | SUBCUTANEOUS | Status: DC
  Administered 2016-05-22 – 2016-05-23 (×2): 7 [IU] via SUBCUTANEOUS
  Filled 2016-05-22 (×2): qty 7

## 2016-05-22 NOTE — Progress Notes (Signed)
Pharmacy Antibiotic Note  Alan Barrett is a 82 y.o. male admitted on 05/19/2016 now with bacteremia.  Pharmacy has been consulted for vancomycin dosing.  Plan: Vancomycin 1 g IV x1 then Vancomycin 1250 mg IV every 18 hours.  Goal trough 15-20 mcg/mL. Stacked dosing. Vanc trough before 4th dose of regimen.   Ke 0.041, half life 17 h, Vd 62.3 L  9/20: Vanc dose given 2.5 h late today, will retime subsequent doses and will retime vanc trough.     Height: 5\' 8"  (172.7 cm) Weight: 199 lb 4.8 oz (90.4 kg) IBW/kg (Calculated) : 68.4  Temp (24hrs), Avg:97.7 F (36.5 C), Min:97.3 F (36.3 C), Max:98 F (36.7 C)   Recent Labs Lab 05/19/16 1427 05/19/16 1528 05/20/16 0830 05/21/16 0348 05/21/16 1604 05/22/16 0426  WBC 13.6*  --  8.8  --   --   --   CREATININE 1.32*  --  1.19 1.26* 1.32* 1.26*  LATICACIDVEN  --  1.8  --   --   --   --     Estimated Creatinine Clearance: 46.8 mL/min (by C-G formula based on SCr of 1.26 mg/dL (H)).    No Known Allergies  Antimicrobials this admission: CTX/azithro 9/17 >> vanc 9/18 >>  Dose adjustments this admission:  Microbiology results:  9/17 BCx: 1/2 CNS   UCx: sent 9/20 BCx: x2 sent   Thank you for allowing pharmacy to be a part of this patient's care.  Rocky Morel 05/22/2016 2:51 PM

## 2016-05-22 NOTE — Care Management Note (Addendum)
Case Management Note  Patient Details  Name: Alan Barrett MRN: [id number] Date of Birth: [birth year]  Subjective/Objective:                  Met with patient and his wife to discuss discharge planning. He was not on O2 while sitting; RN called this RNCM stating that he will need home O2 due to de-sat with ambulation and desat to 88% at rest also. He has also requested a nebulizer machine for home use. Orders are in. His PCP is Dr. Lisette Grinder III. He is usually independent with daily activity. He has not DME at home. He denies problems obtaining medications.    Action/Plan:  [facility] and nebulizer machine prior to discharge to home today. Patient and wife know and agree that they should wait for it to be delivered today. No further RNCM needs.   Expected Discharge Date:                  Expected Discharge Plan:     In-House Referral:     Discharge planning Services  CM Consult  Post Acute Care Choice:  Durable Medical Equipment Choice offered to:  Patient, Spouse  DME Arranged:  Nebulizer machine, Oxygen DME Agency:  [place]:    [place] Agency:     Status of Service:  Completed, signed off  If discussed at [place] of Stay Meetings, dates discussed:    Additional Comments:  Marshell Garfinkel, RN 05/22/2016, 12:25 PM

## 2016-05-22 NOTE — Progress Notes (Signed)
ANTICOAGULATION CONSULT NOTE - Follow up [place] for Warfarin  Indication: atrial fibrillation  No Known Allergies   Patient Measurements: Height: 5\' 8"  (172.7 cm) Weight: 199 lb 4.8 oz (90.4 kg) IBW/kg (Calculated) : 68.4  Vital Signs: Temp: 97.7 F (36.5 C) (09/20 1150) Temp Source: Oral (09/20 1150) BP: 129/80 (09/20 1150) Pulse Rate: 85 (09/20 1150)  Labs:  Recent Labs  05/19/16 2003 05/20/16 0245  05/20/16 0830 05/21/16 0348 05/21/16 1604 05/22/16 0426  HGB  --   --   --  14.4  --   --   --   HCT  --   --   --  40.9  --   --   --   PLT  --   --   --  329  --   --   --   LABPROT  --   --   --  33.1* 31.8*  --  23.6*  INR  --   --   --  3.16 3.00  --  2.07  CREATININE  --   --   < > 1.19 1.26* 1.32* 1.26*  TROPONINI 0.06* 0.03*  --  0.05*  --   --   --   < > = values in this interval not displayed.  Estimated Creatinine Clearance: 46.8 mL/min (by C-G formula based on SCr of 1.26 mg/dL (H)).   Medical History: Past Medical History:  Diagnosis Date  . Cancer ([place])   . Coronary artery disease   . Diabetes mellitus without complication ([place])   . Hyperlipemia   . Hypertension     Assessment: 82 yo male on warfarin PTA for AFib. Notes in Care Everywhere state pt's home regimen to be 5 mg Sat/Sun and 7.5 mg all other days with INR in 1.8-2.2 range last two months.  Pt started on ceftriaxone and azithromycin this admission for PNA.   9/17 :  INR = 3.65 (on admission) - no warfarin 9/18:   INR = 3.16, no warfarin 9/19:   INR = 3.00, no warfarin 9/20:   INR = 2.07  Goal of Therapy:  INR 2-3   Plan:  INR within range but borderline low now. Will order warfarin 6 mg PO x1 for today since warfarin was held for 3 days and INR is now borderline low (still a reduction in daily/weekly dose). Per MD note pt here with CHF/COPD exacerbation and is clinically improving.  Recheck INR tomorrow with AM labs. CBC in AM.  Pharmacy will continue to  follow.  Rayna Sexton, PharmD, BCPS Clinical Pharmacist 05/22/2016 2:46 PM

## 2016-05-22 NOTE — Progress Notes (Signed)
[place] [facility] [place] NAME: Alan Barrett    MR#:  [id number]  DATE OF BIRTH:  16-Feb-1929  SUBJECTIVE: Unable to discharge him because blood cultures are showing MRSA. Patient already on vancomycin. Plan is to repeat blood cultures. Discussed this with patient's nurse. Patient denies any cough. SOB is better.   CHIEF COMPLAINT:   Chief Complaint  Patient presents with  . Cough    REVIEW OF SYSTEMS:   ROS CONSTITUTIONAL: No fever, fatigue or weakness.  EYES: No blurred or double vision.  EARS, NOSE, AND THROAT: No tinnitus or ear pain.  RESPIRATORY: Cough, shortness of breath, wheezing CARDIOVASCULAR: No chest pain, orthopnea, edema.  GASTROINTESTINAL: No nausea, vomiting, diarrhea or abdominal pain.  GENITOURINARY: No dysuria, hematuria.  ENDOCRINE: No polyuria, nocturia,  HEMATOLOGY: No anemia, easy bruising or bleeding SKIN: No rash or lesion. MUSCULOSKELETAL: No joint pain or arthritis.   NEUROLOGIC: No tingling, numbness, weakness.  PSYCHIATRY: No anxiety or depression.   DRUG ALLERGIES:  No Known Allergies  VITALS:  Blood pressure 129/80, pulse 85, temperature 97.7 F (36.5 C), temperature source Oral, resp. rate 18, height 5\' 8"  (1.727 m), weight 90.4 kg (199 lb 4.8 oz), SpO2 95 %.  PHYSICAL EXAMINATION:  GENERAL:  82 y.o.-year-old patient lying in the bed with no acute distress.  EYES: Pupils equal, round, reactive to light and accommodation. No scleral icterus. Extraocular muscles intact.  HEENT: Head atraumatic, normocephalic. Oropharynx and nasopharynx clear.  NECK:  Supple, no jugular venous distention. No thyroid enlargement, no tenderness.  LUNGS: Faint expiratory wheeze in all lung fields.no  rales,rhonchi or crepitation. No use of accessory muscles of respiration.  CARDIOVASCULAR: S1, S2 normal. No murmurs, rubs, or gallops.  ABDOMEN: Soft, nontender, nondistended. Bowel sounds present. No organomegaly or mass.   EXTREMITIES: No pedal edema, cyanosis, or clubbing.  NEUROLOGIC: Cranial nerves II through XII are intact. Muscle strength 5/5 in all extremities. Sensation intact. Gait not checked.  PSYCHIATRIC: The patient is alert and oriented x 3.  SKIN: No obvious rash, lesion, or ulcer.    LABORATORY PANEL:   CBC  Recent Labs Lab 05/20/16 0830  WBC 8.8  HGB 14.4  HCT 40.9  PLT 329   ------------------------------------------------------------------------------------------------------------------  Chemistries   Recent Labs Lab 05/20/16 0830  05/21/16 1604  05/22/16 0426  NA 138  --  137  --  138  K 3.0*  --  2.9*  < > 3.7  CL 99*  --  100*  --  103  CO2 28  --  28  --  30  GLUCOSE 390*  --  243*  --  257*  BUN 29*  --  40*  --  42*  CREATININE 1.19  < > 1.32*  --  1.26*  CALCIUM 8.3*  --  8.2*  --  8.1*  MG  --   --  2.1  --   --   AST 43*  --   --   --   --   ALT 55  --   --   --   --   ALKPHOS 85  --   --   --   --   BILITOT 0.6  --   --   --   --   < > = values in this interval not displayed. ------------------------------------------------------------------------------------------------------------------  Cardiac Enzymes  Recent Labs Lab 05/20/16 0830  TROPONINI 0.05*   ------------------------------------------------------------------------------------------------------------------  RADIOLOGY:  No results found.  EKG:  Orders placed or performed during the hospital encounter of 05/19/16  . ED EKG  . ED EKG    ASSESSMENT AND PLAN:   #1 sob due to copd and chf;linically improving Copd excaerbation;/left base pneumonia: clinically improving.But still has slight wheeze. Continue nebulizers, steroids., IV antibiotics. Patient has MRSA in the blood. Continue vancomycin, repeat blood cultures, hold the discharge today.  diabetesellitus type 2: Uncontrolled secondary to steroids. Continue lantus,novolog, follow recommendations from diabetes coordinator.  #3  chronic atrial fibrillation: Rate controlled. Continue Coumadin  4.nsvt and \\vtach ;likley due to hypokalemia; hypokalemia corrected.   Discussed  with the registered nurse, also discussed with patient.   All the records are reviewed and case discussed with Care Management/Social Workerr. Management plans discussed with the patient, family and they are in agreement.  CODE STATUS: full  TOTAL TIME TAKING CARE OF THIS PATIENT: 35 minutes.   POSSIBLE D/C IN 1-2 DAYS, DEPENDING ON CLINICAL CONDITION.   Epifanio Lesches M.D on 05/22/2016 at 12:06 PM  Between 7am to 6pm - Pager - [phone]  After 6pm go to [email or web address] - password [email or web address] [facility]  Office  [phone]  CC: Primary care physician; Madelyn Brunner, MD   Note: This dictation was prepared with Dragon dictation along with smaller phrase technology. Any transcriptional errors that result from this process are unintentional.

## 2016-05-22 NOTE — Evaluation (Signed)
Physical Therapy Evaluation Patient Details Name: Alan Barrett MRN: [id number] DOB: 1928/09/20 Today's Date: 05/22/2016   History of Present Illness  Pt is a 82 y/o M who presented with 1 week h/o worsening SOB and cough.  Chest x/ray revealed L lingular PNA.  SOB likely due to COPD exacerbation.  Pt's PMH includes cancer, chronic a-fib, CAD, HTN, lung surgery, vascular stent placement.    Clinical Impression  Pt admitted with above diagnosis. Pt currently with functional limitations due to the deficits listed below (see PT Problem List). Pt's SpO2 down as low as 87% on RA at rest and while ambulating and O2 donned at 1L on [place].   Later while ambulating back up to 90% on RA.  See ambulation/gait comments below for more details.  He demonstrates instability with challenges to his balance and will benefit from OPPT at d/c for fall prevention.  He reports h/o ~6 near falls over the past month.  Pt will benefit from skilled PT to increase their independence and safety with mobility to allow discharge to the venue listed below.      Follow Up Recommendations Outpatient PT;Supervision for mobility/OOB    Equipment Recommendations  None recommended by PT    Recommendations for Other Services       Precautions / Restrictions Precautions Precautions: Fall;Other (comment) Restrictions Weight Bearing Restrictions: No      Mobility  Bed Mobility Overal bed mobility: Independent             General bed mobility comments: No cues or physical assist needed  Transfers Overall transfer level: Independent Equipment used: None             General transfer comment: No cues or physical assist needed, no instability noted.  Ambulation/Gait Ambulation/Gait assistance: Min guard Ambulation Distance (Feet): 300 Feet Assistive device: None Gait Pattern/deviations: Decreased stride length;Staggering left;Staggering right;Narrow base of support Gait velocity: decreased Gait velocity  interpretation: <1.8 ft/sec, indicative of risk for recurrent falls General Gait Details: Pt staggers L/R but able to self correct.  Close min guard while ambulating. SpO2 87% at start of ambulation and 1L O2 donned via [place] with SpO2 remaining between 91-92%.  Pt back on RA while ambulating 150 ft and SpO2 remains at or above 90%.  Cues for pursed lip breathing throughout.  Stairs            Wheelchair Mobility    Modified Rankin (Stroke Patients Only)       Balance Overall balance assessment: Needs assistance Sitting-balance support: No upper extremity supported;Feet supported Sitting balance-Leahy Scale: Good     Standing balance support: No upper extremity supported;During functional activity Standing balance-Leahy Scale: Poor Standing balance comment: Unsteady with dynamic activities Single Leg Stance - Right Leg: 3 (3 seconds, pt reaching out for countertop)   Tandem Stance - Right Leg: 6 (6 seconds, pt reaching out for countertop)   [place] - Eyes Opened: 5 (5 seconds, pt reaching out countertop)   High level balance activites: Backward walking;Turns;Sudden stops;Head turns;Other (comment) (Marching) High Level Balance Comments: Instability with all high level balance activities, requires close min guard assist.  Standardized Balance Assessment Standardized Balance Assessment : Dynamic Gait Index   Dynamic Gait Index Level Surface: Mild Impairment Change in Gait Speed: Moderate Impairment Gait with Horizontal Head Turns: Mild Impairment Gait with Vertical Head Turns: Mild Impairment Gait and Pivot Turn: Mild Impairment Step Over Obstacle: Mild Impairment Step Around Obstacles: Mild Impairment       Pertinent  Vitals/Pain Pain Assessment: No/denies pain    Home Living Family/patient expects to be discharged to:: Private residence Living Arrangements: Spouse/significant other Available Help at Discharge: Family;Available 24 hours/day Type of Home: House Home  Access: Stairs to enter Entrance Stairs-Rails: Left Entrance Stairs-Number of Steps: 3 Home Layout: One level Home Equipment: Cane - single point      Prior Function Level of Independence: Independent         Comments: Not using AD PTA; however pt reports ~6 near falls in just the past month.       Hand Dominance   Dominant Hand: Right    Extremity/Trunk Assessment   Upper Extremity Assessment: Overall WFL for tasks assessed           Lower Extremity Assessment: LLE deficits/detail   LLE Deficits / Details: L hip flexion and knee flexion 4/5  Cervical / Trunk Assessment: Kyphotic  Communication   Communication: HOH;Other (comment) (has hearing aides)  Cognition Arousal/Alertness: Awake/alert Behavior During Therapy: WFL for tasks assessed/performed Overall Cognitive Status: Within Functional Limits for tasks assessed                      General Comments      Exercises     Assessment/Plan    PT Assessment Patient needs continued PT services  PT Problem List Decreased strength;Decreased activity tolerance;Decreased balance;Decreased knowledge of use of DME;Decreased safety awareness;Cardiopulmonary status limiting activity          PT Treatment Interventions DME instruction;Gait training;Stair training;Functional mobility training;Therapeutic activities;Therapeutic exercise;Balance training;Neuromuscular re-education;Patient/family education    PT Goals (Current goals can be found in the Care Plan section)  Acute Rehab PT Goals Patient Stated Goal: to go home and to improve balance PT Goal Formulation: With patient/family Time For Goal Achievement: 05/29/16 Potential to Achieve Goals: Good    Frequency Min 2X/week   Barriers to discharge        Co-evaluation               End of Session Equipment Utilized During Treatment: Gait belt;Oxygen Activity Tolerance: Patient tolerated treatment well Patient left: in chair;with call  bell/phone within reach;with chair alarm set;with family/visitor present Nurse Communication: Mobility status;Other (comment) (SpO2)         Time: [id number] PT Time Calculation (min) (ACUTE ONLY): 25 min   Charges:   PT Evaluation $PT Eval Low Complexity: 1 Procedure PT Treatments $Gait Training: 8-22 mins   PT G Codes:        Collie Siad PT, DPT 05/22/2016, 10:44 AM

## 2016-05-22 NOTE — Progress Notes (Signed)
Inpatient Diabetes Program Recommendations  AACE/ADA: New Consensus Statement on Inpatient Glycemic Control (2015)  Target Ranges:  Prepandial:   less than 140 mg/dL      Peak postprandial:   less than 180 mg/dL (1-2 hours)      Critically ill patients:  140 - 180 mg/dL  Results for Alan Barrett, Alan Barrett (MRN [id number]) as of 05/22/2016 09:47  Ref. Range 05/21/2016 07:59 05/21/2016 12:00 05/21/2016 16:35 05/21/2016 20:42 05/22/2016 07:41  Glucose-Capillary Latest Ref Range: 65 - 99 mg/dL 294 (H) 288 (H) 235 (H) 223 (H) 260 (H)    Review of Glycemic Control  Current orders for Inpatient glycemic control: Levemir 18 units daily, Novolog 0-20 units TID with meals, Novolog 0-5 units QHS, Novolog 4 units TID with meals for meal coverage, Metformin 500 mg BID  Inpatient Diabetes Program Recommendations:  Insulin - Basal: Note Levemir was increased to 18 units daily on 9/19 and patient will receive first increased dose this morning.  Insulin - Meal Coverage: If steroids are continued as ordered, please consider increasing meal coverage to Novolog 7 units TID with meals.  Thanks, Barnie Alderman, RN, MSN, CDE Diabetes Coordinator Inpatient Diabetes Program [phone] (Team Pager from [place] to [place]) [phone] ([place] office) [phone] [facility] [place] office) [phone] [facility] office)

## 2016-05-23 DIAGNOSIS — I472 Ventricular tachycardia, unspecified: Secondary | ICD-10-CM

## 2016-05-23 DIAGNOSIS — J441 Chronic obstructive pulmonary disease with (acute) exacerbation: Secondary | ICD-10-CM

## 2016-05-23 DIAGNOSIS — I4891 Unspecified atrial fibrillation: Secondary | ICD-10-CM

## 2016-05-23 DIAGNOSIS — N289 Disorder of kidney and ureter, unspecified: Secondary | ICD-10-CM

## 2016-05-23 LAB — CREATININE, SERUM: CREATININE: 1.06 mg/dL (ref 0.61–1.24)

## 2016-05-23 LAB — GLUCOSE, CAPILLARY
Glucose-Capillary: 101 mg/dL — ABNORMAL HIGH (ref 65–99)
Glucose-Capillary: 189 mg/dL — ABNORMAL HIGH (ref 65–99)

## 2016-05-23 LAB — VANCOMYCIN, TROUGH: Vancomycin Tr: 13 ug/mL — ABNORMAL LOW (ref 15–20)

## 2016-05-23 LAB — CBC
HEMATOCRIT: 40.4 % (ref 40.0–52.0)
HEMOGLOBIN: 13.8 g/dL (ref 13.0–18.0)
MCH: 30.4 pg (ref 26.0–34.0)
MCHC: 34.2 g/dL (ref 32.0–36.0)
MCV: 88.7 fL (ref 80.0–100.0)
Platelets: 321 10*3/uL (ref 150–440)
RBC: 4.55 MIL/uL (ref 4.40–5.90)
RDW: 13.5 % (ref 11.5–14.5)
WBC: 10.2 10*3/uL (ref 3.8–10.6)

## 2016-05-23 LAB — PROTIME-INR
INR: 1.68
Prothrombin Time: 20 seconds — ABNORMAL HIGH (ref 11.4–15.2)

## 2016-05-23 MED ORDER — VANCOMYCIN HCL 10 G IV SOLR
1500.0000 mg | INTRAVENOUS | Status: DC
  Filled 2016-05-23: qty 1500

## 2016-05-23 MED ORDER — INSULIN ASPART 100 UNIT/ML ~~LOC~~ SOLN: 7.0000 [IU] | Freq: Three times a day (TID) | SUBCUTANEOUS | 11 refills | Status: DC

## 2016-05-23 MED ORDER — AZITHROMYCIN 250 MG PO TABS: 250.0000 mg | ORAL_TABLET | Freq: Every day | ORAL | 0 refills | Status: DC

## 2016-05-23 MED ORDER — HYDROCOD POLST-CPM POLST ER 10-8 MG/5ML PO SUER: 5.0000 mL | Freq: Two times a day (BID) | ORAL | 0 refills | Status: AC | PRN

## 2016-05-23 MED ORDER — PREDNISONE 10 MG (21) PO TBPK: 10.0000 mg | ORAL_TABLET | Freq: Every day | ORAL | 0 refills | Status: DC

## 2016-05-23 MED ORDER — GUAIFENESIN ER 600 MG PO TB12: 600.0000 mg | ORAL_TABLET | Freq: Two times a day (BID) | ORAL | 0 refills | Status: DC

## 2016-05-23 MED ORDER — WARFARIN SODIUM 6 MG PO TABS: 6.0000 mg | ORAL_TABLET | Freq: Once | ORAL | 5 refills | Status: DC

## 2016-05-23 MED ORDER — GUAIFENESIN ER 600 MG PO TB12: 600.0000 mg | ORAL_TABLET | Freq: Two times a day (BID) | ORAL | 0 refills | Status: AC

## 2016-05-23 MED ORDER — IPRATROPIUM-ALBUTEROL 0.5-2.5 (3) MG/3ML IN SOLN: 3.0000 mL | Freq: Four times a day (QID) | RESPIRATORY_TRACT | 1 refills | Status: AC

## 2016-05-23 MED ORDER — WARFARIN SODIUM 6 MG PO TABS
6.0000 mg | ORAL_TABLET | Freq: Once | ORAL | Status: DC
  Filled 2016-05-23: qty 1

## 2016-05-23 NOTE — Progress Notes (Signed)
ID E note NO inhouse consult available today. Reviewed history and labs and Xray Admitted with SOB, cough. Wbc 13.  Alan Barrett 1/2 with CNS  I do not think the CNS is significant since I do not see any evidence on CXR of an intravascular device (PPM AICD) Would not treat specifically for CNS>.

## 2016-05-23 NOTE — Discharge Summary (Signed)
[facility] [place] NAME: Alan Barrett    MR#:  [id number]  DATE OF BIRTH:  Oct 25, 1928  DATE OF ADMISSION:  05/19/2016 ADMITTING PHYSICIAN: Idelle Crouch, MD  DATE OF DISCHARGE: 05/23/2016  1:48 PM  PRIMARY CARE PHYSICIAN: Madelyn Brunner, MD     ADMISSION DIAGNOSIS:  Community acquired pneumonia [J18.9] Sepsis, due to unspecified organism ([place]) [A41.9]  DISCHARGE DIAGNOSIS:  Principal Problem:   Acute on chronic respiratory failure with hypoxia ([place]) Active Problems:   CAP (community acquired pneumonia)   COPD exacerbation (HCC)   Tachycardia   Hypokalemia   A-fib (HCC)   V-tach ([place])   Acute renal insufficiency   SECONDARY DIAGNOSIS:   Past Medical History:  Diagnosis Date  . Cancer ([place])   . Coronary artery disease   . Diabetes mellitus without complication ([place])   . Hyperlipemia   . Hypertension     .pro HOSPITAL COURSE:   . The patient is 82 year old Caucasian male with past medical history significant for history of coronary artery disease, diabetes, hypertension, hyperlipidemia, who presents to the hospital with complaints of worsening shortness of breath, cough. On arrival to the hospital. He was noted to be hypoxic. Chest x-ray showed left lingular pneumonia. EKG revealed A. Fib, RVR. Troponin was elevated. Patient was admitted. He was initiated on steroids, inhalation therapy, antibiotics and his condition improved. . Blood cultures revealed Staphylococcus in 1 anaerobic bottle,  Coagulase negative,, which was felt to be contaminant by infectious disease specialist.   discussion by problem :   #1Acute on chronic, likely respiratory failure with hypoxia due to COPD exacerbation ,  Clinically, improved, however, patient qualifies for oxygen therapy at home, continue oxygen 2 L of oxygen through nasal cannula 24 7, weaning off as tolerated . #2. COPD exacerbation, continue steroid taper, antibiotics,  nebulizers, Tussionex, and Humibid . #3.Marland Kitchen Left basilar pneumonia, continue Zithromax to complete course, blood cultures were positive, focal glaze negative Staphylococcus, considered to be contaminant, sputum cultures are not obtained, patient has clinically improved on Rocephin and Zithromax . #4. Renal insufficiency, resolved with conservative therapy . #5. Elevated troponin, likely demand ischemia due to shortness of breath, echocardiogram was normal, patient is to continue atorvastatin, metoprolol, aspirin . #6. Tachycardia with episode of V. Tach during this hospitalization, which was felt to be due to hypokalemia, resolved . #7. Diabetes mellitus type 2, poorly controlled on steroids, patient is to continue strict diabetic diet, his outpatient medications, tapering off steroids DISCHARGE CONDITIONS:   . Stable  CONSULTS OBTAINED:  Treatment Team:  Teodoro Spray, MD Leonel Ramsay, MD  DRUG ALLERGIES:  No Known Allergies  DISCHARGE MEDICATIONS:   Discharge Medication List as of 05/23/2016 12:53 PM    START taking these medications   Details  chlorpheniramine-HYDROcodone (TUSSIONEX) 10-8 MG/5ML SUER Take 5 mLs by mouth every 12 (twelve) hours as needed for cough., Starting Thu 05/23/2016, Normal    azithromycin (ZITHROMAX) 250 MG tablet Take 1 tablet (250 mg total) by mouth daily., Starting Thu 05/23/2016, Normal    guaiFENesin (MUCINEX) 600 MG 12 hr tablet Take 1 tablet (600 mg total) by mouth 2 (two) times daily., Starting Thu 05/23/2016, Normal    ipratropium-albuterol (DUONEB) 0.5-2.5 (3) MG/3ML SOLN Take 3 mLs by nebulization 4 (four) times daily., Starting Wed 05/22/2016, Normal    predniSONE (STERAPRED UNI-PAK 21 TAB) 10 MG (21) TBPK tablet Take 1 tablet (10 mg total) by mouth daily. Taper  By 10 mg  Daily as directed, Starting Wed 05/22/2016, Normal      CONTINUE these medications which have NOT CHANGED   Details  amLODipine (NORVASC) 5 MG tablet Take 5 mg by mouth  daily., Starting Fri 06/30/2015, Historical Med    aspirin EC 81 MG tablet Take 81 mg by mouth daily., Historical Med    atorvastatin (LIPITOR) 40 MG tablet Take 40 mg by mouth daily., Starting Mon 12/18/2015, Historical Med    hydrochlorothiazide (HYDRODIURIL) 12.5 MG tablet Take 25 mg by mouth daily., Starting Fri 06/30/2015, Historical Med    ketoconazole (NIZORAL) 2 % shampoo Apply 1 application topically 3 (three) times a week., Historical Med    latanoprost (XALATAN) 0.005 % ophthalmic solution Place 1 drop into both eyes at bedtime., Starting Thu 03/21/2016, Historical Med    metFORMIN (GLUCOPHAGE) 500 MG tablet Take 500 mg by mouth 2 (two) times daily., Starting Fri 06/30/2015, Historical Med    metoprolol tartrate (LOPRESSOR) 25 MG tablet Take 25 mg by mouth 2 (two) times daily., Starting Fri 06/30/2015, Historical Med    omeprazole (PRILOSEC) 20 MG capsule Take 20 mg by mouth daily., Starting Mon 12/18/2015, Historical Med    PROAIR HFA 108 (90 Base) MCG/ACT inhaler Take 2 puffs by mouth every 4 (four) hours as needed., Starting Wed 05/15/2016, Historical Med    warfarin (COUMADIN) 5 MG tablet Take 5 mg by mouth every evening. Pt is currently on coumadin 5 mg on Saturday and Sunday and coumadin 7.5 mg all other days., Starting Tue 10/31/2015, Historical Med      STOP taking these medications     diphenhydramine-acetaminophen (TYLENOL PM) 25-500 MG TABS tablet      insulin aspart (NOVOLOG) 100 UNIT/ML injection          DISCHARGE INSTRUCTIONS:    . Patient is to follow-up with primary care physician  If you experience worsening of your admission symptoms, develop shortness of breath, life threatening emergency, suicidal or homicidal thoughts you must seek medical attention immediately by calling 911 or calling your MD immediately  if symptoms less severe.  You Must read complete instructions/literature along with all the possible adverse reactions/side effects for all the  Medicines you take and that have been prescribed to you. Take any new Medicines after you have completely understood and accept all the possible adverse reactions/side effects.   Please note  You were cared for by a hospitalist during your hospital stay. If you have any questions about your discharge medications or the care you received while you were in the hospital after you are discharged, you can call the unit and asked to speak with the hospitalist on call if the hospitalist that took care of you is not available. Once you are discharged, your primary care physician will handle any further medical issues. Please note that NO REFILLS for any discharge medications will be authorized once you are discharged, as it is imperative that you return to your primary care physician (or establish a relationship with a primary care physician if you do not have one) for your aftercare needs so that they can reassess your need for medications and monitor your lab values.    Today   CHIEF COMPLAINT:   Chief Complaint  Patient presents with  . Cough    HISTORY OF PRESENT ILLNESS:  Alan Barrett  is a 82 y.o. male with a known history of coronary artery disease, diabetes, hypertension, hyperlipidemia, who presents to the hospital with complaints of worsening  shortness of breath, cough. On arrival to the hospital. He was noted to be hypoxic. Chest x-ray showed left lingular pneumonia. EKG revealed A. Fib, RVR. Troponin was elevated. Patient was admitted. He was initiated on steroids, inhalation therapy, antibiotics and his condition improved. . Blood cultures revealed Staphylococcus in 1 anaerobic bottle,  Coagulase negative,, which was felt to be contaminant by infectious disease specialist.   discussion by problem :   #1Acute on chronic, likely respiratory failure with hypoxia due to COPD exacerbation ,  Clinically, improved, however, patient qualifies for oxygen therapy at home, continue oxygen 2 L of oxygen  through nasal cannula 24 7, weaning off as tolerated . #2. COPD exacerbation, continue steroid taper, antibiotics, nebulizers, Tussionex, and Humibid . #3.Marland Kitchen Left basilar pneumonia, continue Zithromax to complete course, blood cultures were positive, focal glaze negative Staphylococcus, considered to be contaminant, sputum cultures are not obtained, patient has clinically improved on Rocephin and Zithromax . #4. Renal insufficiency, resolved with conservative therapy . #5. Elevated troponin, likely demand ischemia due to shortness of breath, echocardiogram was normal, patient is to continue atorvastatin, metoprolol, aspirin . #6. Tachycardia with episode of V. Tach during this hospitalization, which was felt to be due to hypokalemia, resolved . #7. Diabetes mellitus type 2, poorly controlled on steroids, patient is to continue strict diabetic diet, his outpatient medications, tapering off steroids    VITAL SIGNS:  Blood pressure 125/77, pulse 85, temperature 97.7 F (36.5 C), temperature source Oral, resp. rate 18, height 5\' 8"  (1.727 m), weight 90.1 kg (198 lb 11.2 oz), SpO2 91 %.  I/O:   Intake/Output Summary (Last 24 hours) at 05/23/16 1422 Last data filed at 05/23/16 0802  Gross per 24 hour  Intake              240 ml  Output             2345 ml  Net            -2105 ml    PHYSICAL EXAMINATION:  GENERAL:  82 y.o.-year-old patient lying in the bed with no acute distress.  EYES: Pupils equal, round, reactive to light and accommodation. No scleral icterus. Extraocular muscles intact.  HEENT: Head atraumatic, normocephalic. Oropharynx and nasopharynx clear.  NECK:  Supple, no jugular venous distention. No thyroid enlargement, no tenderness.  LUNGS: Normal breath sounds bilaterally, no wheezing, rales,rhonchi or crepitation. No use of accessory muscles of respiration.  CARDIOVASCULAR: S1, S2 normal. No murmurs, rubs, or gallops.  ABDOMEN: Soft, non-tender, non-distended. Bowel sounds  present. No organomegaly or mass.  EXTREMITIES: No pedal edema, cyanosis, or clubbing.  NEUROLOGIC: Cranial nerves II through XII are intact. Muscle strength 5/5 in all extremities. Sensation intact. Gait not checked.  PSYCHIATRIC: The patient is alert and oriented x 3.  SKIN: No obvious rash, lesion, or ulcer.   DATA REVIEW:   CBC  Recent Labs Lab 05/23/16 0337  WBC 10.2  HGB 13.8  HCT 40.4  PLT 321    Chemistries   Recent Labs Lab 05/20/16 0830  05/21/16 1604  05/22/16 0426 05/23/16 0746  NA 138  --  137  --  138  --   K 3.0*  --  2.9*  < > 3.7  --   CL 99*  --  100*  --  103  --   CO2 28  --  28  --  30  --   GLUCOSE 390*  --  243*  --  257*  --  BUN 29*  --  40*  --  42*  --   CREATININE 1.19  < > 1.32*  --  1.26* 1.06  CALCIUM 8.3*  --  8.2*  --  8.1*  --   MG  --   --  2.1  --   --   --   AST 43*  --   --   --   --   --   ALT 55  --   --   --   --   --   ALKPHOS 85  --   --   --   --   --   BILITOT 0.6  --   --   --   --   --   < > = values in this interval not displayed.  Cardiac Enzymes  Recent Labs Lab 05/20/16 0830  TROPONINI 0.05*    Microbiology Results  Results for orders placed or performed during the hospital encounter of 05/19/16  Culture, blood (Routine X 2) w Reflex to ID Panel     Status: None (Preliminary result)   Collection Time: 05/19/16  4:00 PM  Result Value Ref Range Status   Specimen Description BLOOD  RIGHT AC  Final   Special Requests   Final    BOTTLES DRAWN AEROBIC AND ANAEROBIC  AER 5CC ANA 4CC   Culture NO GROWTH 4 DAYS  Final   Report Status PENDING  Incomplete  Culture, blood (Routine X 2) w Reflex to ID Panel     Status: Abnormal   Collection Time: 05/19/16  4:05 PM  Result Value Ref Range Status   Specimen Description BLOOD LEFT ANTECUBITAL  Final   Special Requests   Final    BOTTLES DRAWN AEROBIC AND ANAEROBIC  AER 4CC ANA .[place]   Culture  Setup Time   Final    GRAM POSITIVE COCCI ANAEROBIC BOTTLE  ONLY CRITICAL RESULT CALLED TO, READ BACK BY AND VERIFIED WITH: KRISTEN MERRILL ON 05/20/16 AT 1429 BY [facility]    Culture (A)  Final    STAPHYLOCOCCUS SPECIES (COAGULASE NEGATIVE) THE SIGNIFICANCE OF ISOLATING THIS ORGANISM FROM A SINGLE SET OF BLOOD CULTURES WHEN MULTIPLE SETS ARE DRAWN IS UNCERTAIN. PLEASE NOTIFY THE MICROBIOLOGY DEPARTMENT WITHIN ONE WEEK IF SPECIATION AND SENSITIVITIES ARE REQUIRED. Performed at [facility]    Report Status 05/22/2016 FINAL  Final  Blood Culture ID Panel (Reflexed)     Status: Abnormal   Collection Time: 05/19/16  4:05 PM  Result Value Ref Range Status   Enterococcus species NOT DETECTED NOT DETECTED Final   Listeria monocytogenes NOT DETECTED NOT DETECTED Final   Staphylococcus species DETECTED (A) NOT DETECTED Final    Comment: CRITICAL RESULT CALLED TO, READ BACK BY AND VERIFIED WITH: KRISTEN MERRILL ON 05/20/16 AT 1429 BY [facility]    Staphylococcus aureus NOT DETECTED NOT DETECTED Final   Methicillin resistance DETECTED (A) NOT DETECTED Final    Comment: CRITICAL RESULT CALLED TO, READ BACK BY AND VERIFIED WITH: KRISTEN MERRILL ON 05/20/16 AT 1429 BY [facility]    Streptococcus species NOT DETECTED NOT DETECTED Final   Streptococcus agalactiae NOT DETECTED NOT DETECTED Final   Streptococcus pneumoniae NOT DETECTED NOT DETECTED Final   Streptococcus pyogenes NOT DETECTED NOT DETECTED Final   Acinetobacter baumannii NOT DETECTED NOT DETECTED Final   Enterobacteriaceae species NOT DETECTED NOT DETECTED Final   Enterobacter cloacae complex NOT DETECTED NOT DETECTED Final   Escherichia coli NOT DETECTED NOT DETECTED Final   Klebsiella  oxytoca NOT DETECTED NOT DETECTED Final   Klebsiella pneumoniae NOT DETECTED NOT DETECTED Final   Proteus species NOT DETECTED NOT DETECTED Final   Serratia marcescens NOT DETECTED NOT DETECTED Final   Haemophilus influenzae NOT DETECTED NOT DETECTED Final   Neisseria meningitidis NOT DETECTED NOT DETECTED Final    Pseudomonas aeruginosa NOT DETECTED NOT DETECTED Final   Candida albicans NOT DETECTED NOT DETECTED Final   Candida glabrata NOT DETECTED NOT DETECTED Final   Candida krusei NOT DETECTED NOT DETECTED Final   Candida parapsilosis NOT DETECTED NOT DETECTED Final   Candida tropicalis NOT DETECTED NOT DETECTED Final  CULTURE, BLOOD (ROUTINE X 2) w Reflex to ID Panel     Status: None (Preliminary result)   Collection Time: 05/22/16 12:47 PM  Result Value Ref Range Status   Specimen Description BLOOD LEFT ASSIST CONTROL  Final   Special Requests BOTTLES DRAWN AEROBIC AND ANAEROBIC  [place]  Final   Culture NO GROWTH < 24 HOURS  Final   Report Status PENDING  Incomplete  CULTURE, BLOOD (ROUTINE X 2) w Reflex to ID Panel     Status: None (Preliminary result)   Collection Time: 05/22/16 12:47 PM  Result Value Ref Range Status   Specimen Description BLOOD RIGHT ASSIST CONTROL  Final   Special Requests BOTTLES DRAWN AEROBIC AND ANAEROBIC  6CC  Final   Culture NO GROWTH < 24 HOURS  Final   Report Status PENDING  Incomplete    RADIOLOGY:  No results found.  EKG:   Orders placed or performed during the hospital encounter of 05/19/16  . ED EKG  . ED EKG      Management plans discussed with the patient, family and they are in agreement.  CODE STATUS:     Code Status Orders        Start     Ordered   05/21/16 0709  Full code  Continuous     05/21/16 0708    Code Status History    Date Active Date Inactive Code Status Order ID Comments User Context   05/19/2016  6:56 PM 05/20/2016  7:51 AM Full Code [id number]  Idelle Crouch, MD Inpatient      TOTAL TIME TAKING CARE OF THIS PATIENT:  40 minutes.    Theodoro Grist M.D on 05/23/2016 at 2:22 PM  Between 7am to 6pm - Pager - [phone]  After 6pm go to [email or web address] - password [email or web address] [facility]  Office  [phone]  CC: Primary care physician; Madelyn Brunner, MD

## 2016-05-23 NOTE — Progress Notes (Signed)
ANTICOAGULATION CONSULT NOTE - Follow up [facility] for Warfarin  Indication: atrial fibrillation  No Known Allergies   Patient Measurements: Height: 5\' 8"  (172.7 cm) Weight: 198 lb 11.2 oz (90.1 kg) IBW/kg (Calculated) : 68.4  Vital Signs: Temp: 98 F (36.7 C) (09/21 0402) Temp Source: Oral (09/21 0402) BP: 129/72 (09/21 0402) Pulse Rate: 63 (09/21 0402)  Labs:  Recent Labs  05/21/16 0348 05/21/16 1604 05/22/16 0426 05/23/16 0337 05/23/16 0746  HGB  --   --   --  13.8  --   HCT  --   --   --  40.4  --   PLT  --   --   --  321  --   LABPROT 31.8*  --  23.6* 20.0*  --   INR 3.00  --  2.07 1.68  --   CREATININE 1.26* 1.32* 1.26*  --  1.06    Estimated Creatinine Clearance: 55.6 mL/min (by C-G formula based on SCr of 1.06 mg/dL).   Medical History: Past Medical History:  Diagnosis Date  . Cancer ([place])   . Coronary artery disease   . Diabetes mellitus without complication ([place])   . Hyperlipemia   . Hypertension     Assessment: 82 yo male on warfarin PTA for AFib. Notes in Care Everywhere state pt's home regimen to be 5 mg Sat/Sun and 7.5 mg all other days with INR in 1.8-2.2 range last two months.  Pt started on ceftriaxone and azithromycin this admission for PNA.   9/17 :  INR = 3.65 (on admission) - no warfarin 9/18:   INR = 3.16, no warfarin 9/19:   INR = 3.00, no warfarin 9/20:   INR = 2.07, warfarin 6 mg 9/21:   INR = 1.68  Goal of Therapy:  INR 2-3   Plan:  INR subtherapeutic today likely as a result of holding for 3 days. Will repeat warfarin 6 mg PO x1 for today. Per MD note pt here with CHF/COPD exacerbation and is clinically improving.  Recheck INR tomorrow with AM labs.   Pharmacy will continue to follow.  Rayna Sexton, PharmD, BCPS Clinical Pharmacist 05/23/2016 9:07 AM

## 2016-05-23 NOTE — Progress Notes (Signed)
SATURATION QUALIFICATIONS: (This note is used to comply with regulatory documentation for home oxygen)  Patient Saturations on Room Air at Rest = 87%      

## 2016-05-23 NOTE — Progress Notes (Signed)
Pharmacy Antibiotic Note  Alan Barrett is a 82 y.o. male admitted on 05/19/2016 now with bacteremia.  Pharmacy has been consulted for vancomycin dosing.  Plan: Vancomycin 1 g IV x1 then Vancomycin 1250 mg IV every 18 hours.  Goal trough 15-20 mcg/mL. Stacked dosing. Vanc trough before 4th dose of regimen.   Ke 0.041, half life 17 h, Vd 62.3 L  9/20: Vanc dose given 2.5 h late today, will retime subsequent doses and will retime vanc trough.   9/31: Trough before 4th dose = 13. Will increase dose to 1500 mg IV q18h. Trough before next 4th dose of regimen - 9/23 at 1530. Renal function improved.     Height: 5\' 8"  (172.7 cm) Weight: 198 lb 11.2 oz (90.1 kg) IBW/kg (Calculated) : 68.4  Temp (24hrs), Avg:97.8 F (36.6 C), Min:97.7 F (36.5 C), Max:98 F (36.7 C)   Recent Labs Lab 05/19/16 1427 05/19/16 1528 05/20/16 0830 05/21/16 0348 05/21/16 1604 05/22/16 0426 05/23/16 0337 05/23/16 0746  WBC 13.6*  --  8.8  --   --   --  10.2  --   CREATININE 1.32*  --  1.19 1.26* 1.32* 1.26*  --  1.06  LATICACIDVEN  --  1.8  --   --   --   --   --   --   VANCOTROUGH  --   --   --   --   --   --   --  13*    Estimated Creatinine Clearance: 55.6 mL/min (by C-G formula based on SCr of 1.06 mg/dL).    No Known Allergies  Antimicrobials this admission: CTX/azithro 9/17 >> vanc 9/18 >>  Dose adjustments this admission: 9/12 1250 mg q18h to 1500 mg q18h  Microbiology results:  9/17 BCx: 1/2 CNS   UCx: sent 9/20 BCx: x2 NGTD   Thank you for allowing pharmacy to be a part of this patient's care.  Rocky Morel 05/23/2016 8:55 AM

## 2016-05-23 NOTE — Care Management (Signed)
Anticipate discharge home today.  There was a qualifying resting on room air sat documented by physical therapy yesterday at 10:40 Primary nurse will document a complete assessment if is needed.  Order present for home 02 and nebulizer.  Agreeable to home health nursing.  Agency preference is [facility].  Referral called. Request SN see within 24 hours of discharge.

## 2016-05-24 LAB — CULTURE, BLOOD (ROUTINE X 2): Culture: NO GROWTH

## 2016-05-28 LAB — CULTURE, BLOOD (ROUTINE X 2)
CULTURE: NO GROWTH
CULTURE: NO GROWTH

## 2017-02-17 ENCOUNTER — Other Ambulatory Visit: Payer: Self-pay | Admitting: Student

## 2017-02-17 ENCOUNTER — Telehealth: Payer: Self-pay | Admitting: *Deleted

## 2017-02-17 DIAGNOSIS — M47816 Spondylosis without myelopathy or radiculopathy, lumbar region: Secondary | ICD-10-CM

## 2017-02-17 DIAGNOSIS — M533 Sacrococcygeal disorders, not elsewhere classified: Secondary | ICD-10-CM

## 2017-02-19 ENCOUNTER — Other Ambulatory Visit: Payer: Self-pay | Admitting: Student

## 2017-02-19 DIAGNOSIS — M533 Sacrococcygeal disorders, not elsewhere classified: Secondary | ICD-10-CM

## 2017-02-19 DIAGNOSIS — M47816 Spondylosis without myelopathy or radiculopathy, lumbar region: Secondary | ICD-10-CM

## 2017-02-20 ENCOUNTER — Inpatient Hospital Stay: Admission: RE | Admit: 2017-02-20 | Payer: Medicare Other | Source: Ambulatory Visit

## 2017-02-25 ENCOUNTER — Other Ambulatory Visit: Payer: Self-pay | Admitting: Student

## 2017-02-25 ENCOUNTER — Ambulatory Visit
Admission: RE | Admit: 2017-02-25 | Discharge: 2017-02-25 | Disposition: A | Discharge status: Home / Self Care | Payer: Medicare Other | Source: Ambulatory Visit | Attending: Student | Admitting: Student

## 2017-02-25 ENCOUNTER — Ambulatory Visit: Payer: Medicare Other

## 2017-02-25 DIAGNOSIS — M47816 Spondylosis without myelopathy or radiculopathy, lumbar region: Secondary | ICD-10-CM | POA: Insufficient documentation

## 2017-02-25 DIAGNOSIS — M533 Sacrococcygeal disorders, not elsewhere classified: Secondary | ICD-10-CM

## 2017-02-25 HISTORY — DX: Gastro-esophageal reflux disease without esophagitis: K21.9

## 2017-02-25 HISTORY — DX: Chronic obstructive pulmonary disease, unspecified: J44.9

## 2017-02-25 LAB — PROTIME-INR
INR: 0.94
Prothrombin Time: 12.6 seconds (ref 11.4–15.2)

## 2017-02-25 LAB — APTT: aPTT: 27 seconds (ref 24–36)

## 2017-02-25 MED ORDER — TRIAMCINOLONE ACETONIDE 40 MG/ML IJ SUSP
INTRAMUSCULAR | Status: AC
  Filled 2017-02-25: qty 2

## 2017-02-25 MED ORDER — ROPIVACAINE HCL 5 MG/ML IJ SOLN
INTRAMUSCULAR | Status: AC
  Filled 2017-02-25: qty 30

## 2017-02-25 NOTE — OR Nursing (Signed)
Labs results reviewed with Dr Posey Pronto

## 2017-02-25 NOTE — Discharge Instructions (Signed)
hip Injection A steriod injection is a procedure to get medicine into your hip joints. Your health care provider puts a needle into the joint and injects medicine with an attached syringe. The injected medicine may relieve the pain, swelling, and stiffness of arthritis. The injected medicine may also help to lubricate and cushion your hip joints. You may need more than one injection. Tell a health care provider about:  Any allergies you have.  All medicines you are taking, including vitamins, herbs, eye drops, creams, and over-the-counter medicines.  Any problems you or family members have had with anesthetic medicines.  Any blood disorders you have.  Any surgeries you have had.  Any medical conditions you have. What are the risks? Generally, this is a safe procedure. However, problems may occur, including:  Infection.  Bleeding.  Worsening symptoms.  Damage to the area around your knee.  Allergic reaction to any of the medicines.  Skin reactions from repeated injections.  What happens before the procedure?  Ask your health care provider about changing or stopping your regular medicines. This is especially important if you are taking diabetes medicines or blood thinners.  Plan to have someone take you home after the procedure. What happens during the procedure?  You will sit or lie down in a position for your knee to be treated.  The skin over your kneecap will be cleaned with a germ-killing solution (antiseptic).  You will be given a medicine that numbs the area (local anesthetic). You may feel some stinging.  After your knee becomes numb, you will have a second injection. This is the medicine. This needle is carefully placed between your kneecap and your knee. The medicine is injected into the joint space.  At the end of the procedure, the needle will be removed.  A bandage (dressing) may be placed over the injection site. The procedure may vary among health care  providers and hospitals. What happens after the procedure?  You may have to move your hips through its full range of motion. This helps to get all of the medicine into your joint space.  Your blood pressure, heart rate, breathing rate, and blood oxygen level will be monitored often until the medicines you were given have worn off.  You will be watched to make sure that you do not have a reaction to the injected medicine. This information is not intended to replace advice given to you by your health care provider. Make sure you discuss any questions you have with your health care provider. Document Released: 11/10/2006 Document Revised: 01/19/2016 Document Reviewed: 06/29/2014 Elsevier Interactive Patient Education  2018 [employer].

## 2017-11-06 ENCOUNTER — Other Ambulatory Visit: Payer: Self-pay | Admitting: Student

## 2017-11-06 DIAGNOSIS — M461 Sacroiliitis, not elsewhere classified: Secondary | ICD-10-CM

## 2017-11-17 ENCOUNTER — Other Ambulatory Visit: Payer: Self-pay | Admitting: Radiology

## 2017-11-17 ENCOUNTER — Ambulatory Visit: Payer: Medicare Other

## 2017-11-18 ENCOUNTER — Other Ambulatory Visit: Payer: Self-pay | Admitting: Student

## 2017-11-18 ENCOUNTER — Ambulatory Visit: Payer: Medicare Other

## 2017-11-18 ENCOUNTER — Ambulatory Visit
Admission: RE | Admit: 2017-11-18 | Discharge: 2017-11-18 | Disposition: A | Discharge status: Home / Self Care | Payer: Medicare Other | Source: Ambulatory Visit | Attending: Student | Admitting: Student

## 2017-11-18 DIAGNOSIS — M461 Sacroiliitis, not elsewhere classified: Secondary | ICD-10-CM

## 2017-11-18 LAB — CBC
HEMATOCRIT: 41.4 % (ref 40.0–52.0)
Hemoglobin: 14.2 g/dL (ref 13.0–18.0)
MCH: 30.2 pg (ref 26.0–34.0)
MCHC: 34.3 g/dL (ref 32.0–36.0)
MCV: 88 fL (ref 80.0–100.0)
Platelets: 325 10*3/uL (ref 150–440)
RBC: 4.71 MIL/uL (ref 4.40–5.90)
RDW: 13.9 % (ref 11.5–14.5)
WBC: 7.3 10*3/uL (ref 3.8–10.6)

## 2017-11-18 LAB — PROTIME-INR
INR: 0.9
Prothrombin Time: 12.1 seconds (ref 11.4–15.2)

## 2017-11-18 LAB — APTT: aPTT: 28 seconds (ref 24–36)

## 2017-11-18 MED ORDER — TRIAMCINOLONE ACETONIDE 40 MG/ML IJ SUSP
INTRAMUSCULAR | Status: AC
  Administered 2017-11-18: 80 mg
  Filled 2017-11-18: qty 2

## 2017-11-18 MED ORDER — IOPAMIDOL (ISOVUE-300) INJECTION 61%
5.0000 mL | Freq: Once | INTRAVENOUS | Status: AC | PRN
  Administered 2017-11-18: 5 mL

## 2017-11-18 MED ORDER — LIDOCAINE HCL (PF) 1 % IJ SOLN
10.0000 mL | Freq: Once | INTRAMUSCULAR | Status: DC
  Filled 2017-11-18: qty 10

## 2017-11-18 MED ORDER — ROPIVACAINE HCL 5 MG/ML IJ SOLN
INTRAMUSCULAR | Status: AC
  Administered 2017-11-18: 2 mL
  Filled 2017-11-18: qty 30

## 2017-11-18 NOTE — Discharge Instructions (Signed)
Steriod Joint injection, Care After Refer to this sheet in the next few weeks. These instructions provide you with information about caring for yourself after your procedure. Your health care provider may also give you more specific instructions. Your treatment has been planned according to current medical practices, but problems sometimes occur. Call your health care provider if you have any problems or questions after your procedure. What can I expect after the procedure? After the procedure, it is common to have:  Some tenderness over the injection sites for 2 days after the procedure.  A temporary increase in blood sugar if you have diabetes.  Follow these instructions at home:  Keep track of the amount of pain relief you feel and how long it lasts.  Take over-the-counter and prescription medicines only as told by your health care provider. You may need to limit pain medicine within the first 4-6 hours after the procedure.  Remove your bandages (dressings) the morning after the procedure.  For the first 24 hours after the procedure: ? Do not apply heat near or over the injection sites. ? Do not take a bath or soak in water, such as in a pool or lake. ? Do not drive or operate heavy machinery unless approved by your health care provider. ? Avoid activities that require a lot of energy.  If the injection site is tender, try applying ice to the area. To do this: ? Put ice in a plastic bag. ? Place a towel between your skin and the bag. ? Leave the ice on for 20 minutes, 2-3 times a day.  Keep all follow-up visits as told by your health care provider. This is important. Contact a health care provider if:  Fluid is coming from an injection site.  There is significant bleeding or swelling at an injection site.  You have diabetes and your blood sugar is above 180 mg/dL. Get help right away if:  You have a fever.  You have worsening pain or swelling around an injection  site.  There are red streaks around an injection site.  You develop severe pain that is not controlled by your medicines.  You develop a headache, stiff neck, nausea, or vomiting.  Your eyes become very sensitive to light.  You have weakness, paralysis, or tingling in your arms or legs that was not present before the procedure.  You have difficulty urinating or breathing. This information is not intended to replace advice given to you by your health care provider. Make sure you discuss any questions you have with your health care provider. Document Released: 08/05/2012 Document Revised: 01/03/2016 Document Reviewed: 05/15/2015 Elsevier Interactive Patient Education  [employer].

## 2018-01-04 IMAGING — CT CT GUIDANCE NEEDLE PLACEMENT
2 series · 11 of 14 positions shown, 12 images · non-contrast
Comparison: none

CLINICAL DATA: Low back pain. Patient presents for bilateral
sacroiliac joint injections.

[Series 2: i-spiral 5.0 b30f · axial · 0.70mm/px · z∈[+877,+1017]mm · 3 of 41 slices shown, 4 images]
[im 1/41  soft-tissue]
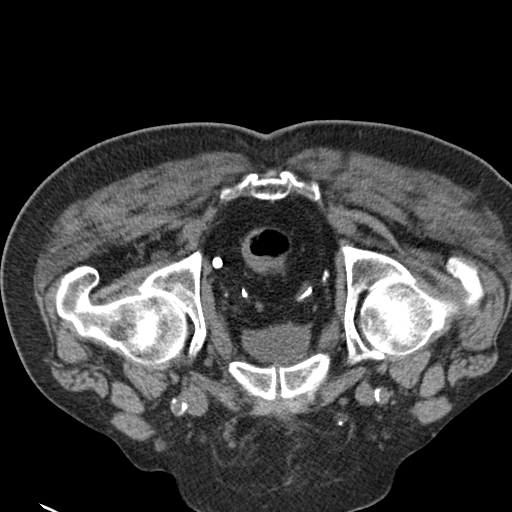
[im 1/41  bone]
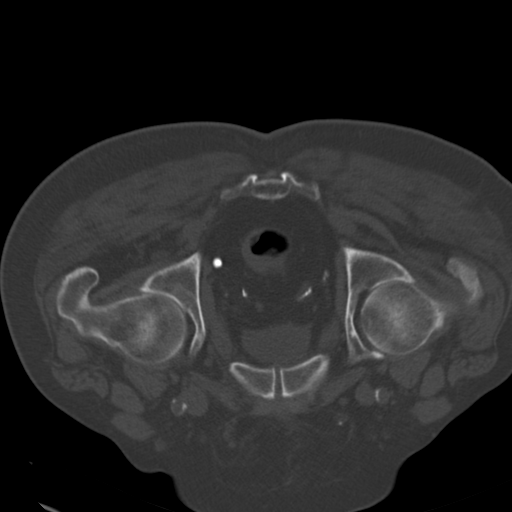
[im 21/41  bone]
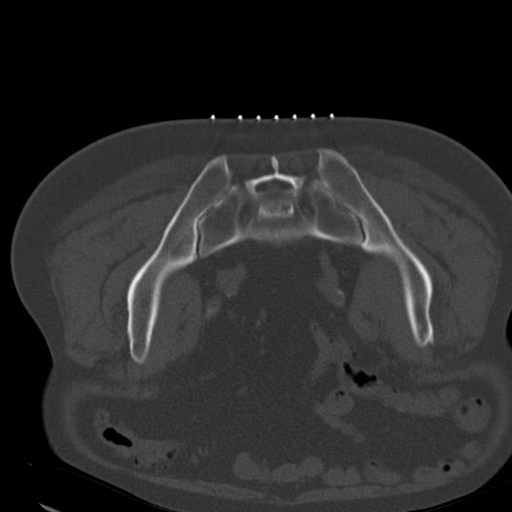
[im 41/41  bone]
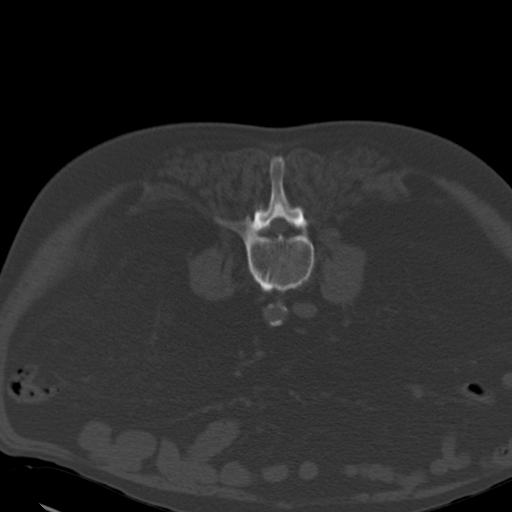

[Series 3: i-sequence 2.4 b30s · axial · 0.70mm/px · z∈[+934,+944]mm · 8 of 168 slices shown]
[im 17/168  bone]
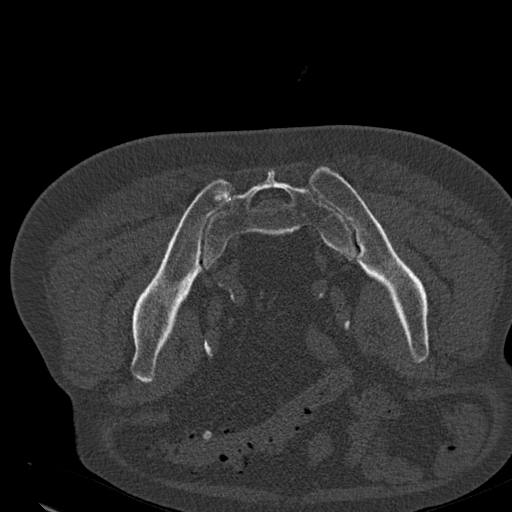
[im 34/168  bone]
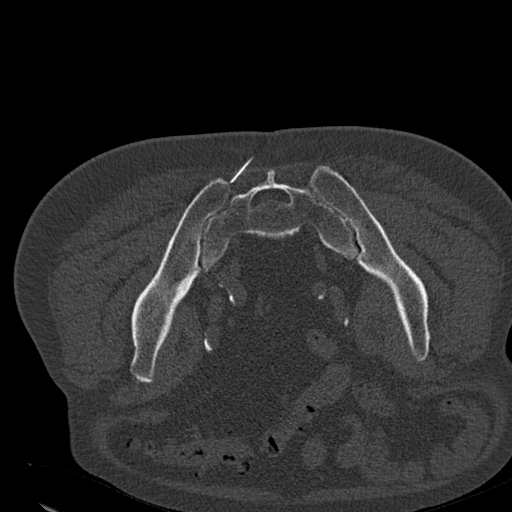
[im 51/168  bone]
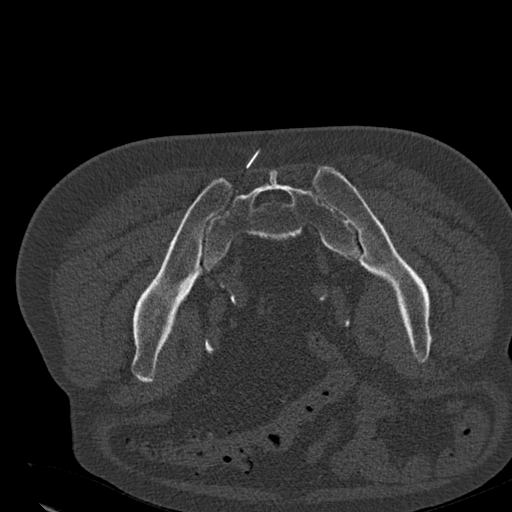
[im 67/168  bone]
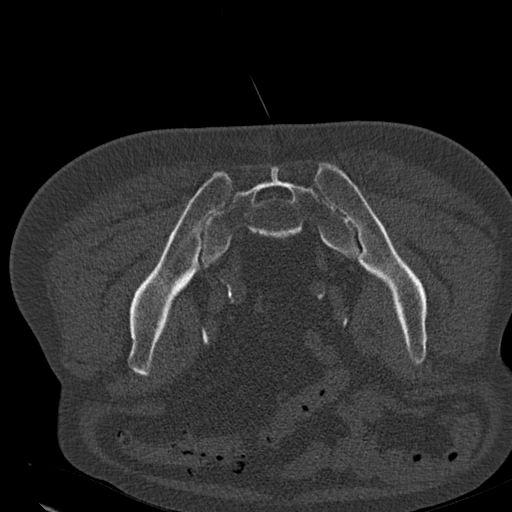
[im 101/168  bone]
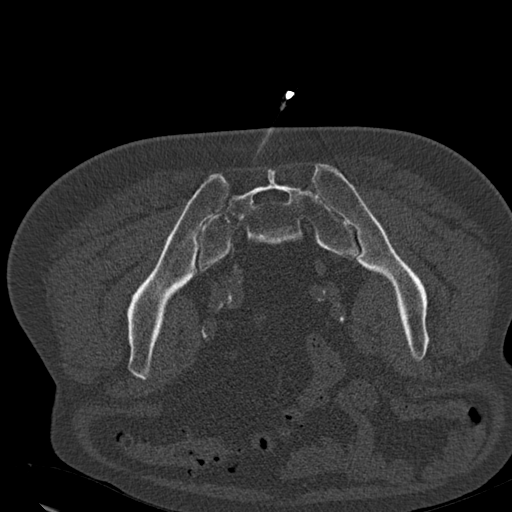
[im 117/168  bone]
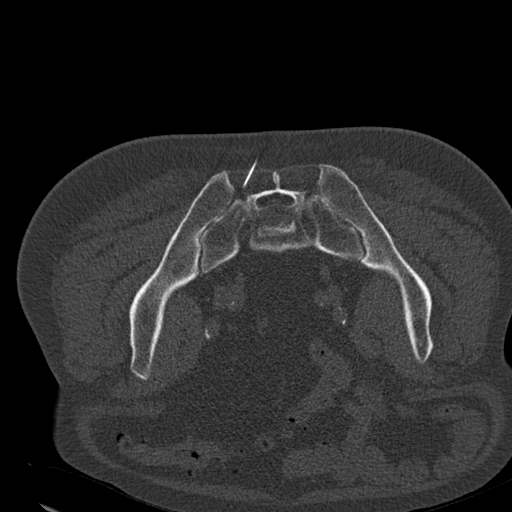
[im 134/168  bone]
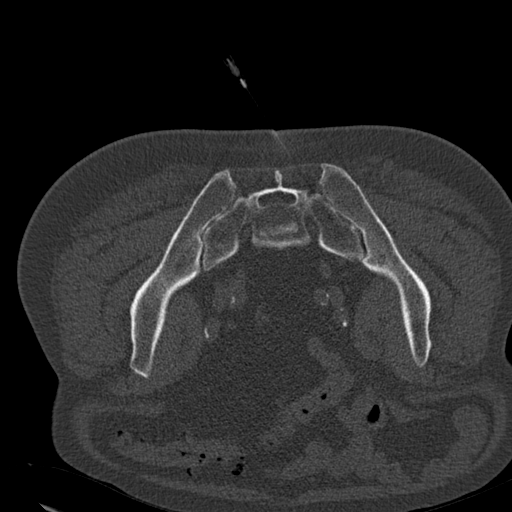
[im 151/168  bone]
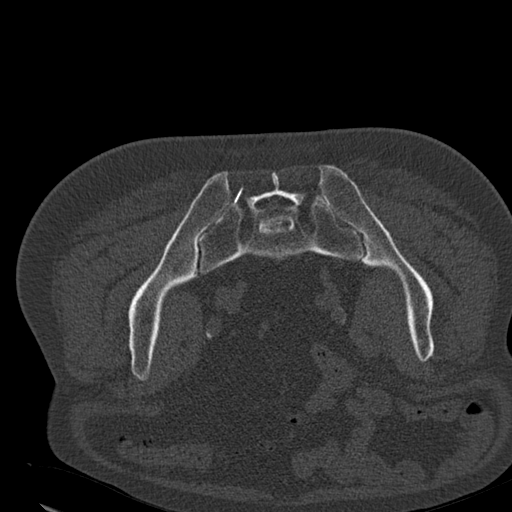

[11 of 14 positions shown; findings below may reference images not displayed]

EXAM:
CT GUIDED RIGHT SI JOINT INJECTION

CT GUIDED LEFT SI JOINT INJECTION



After local anesthesia with 1% lidocaine without epinephrine and
subsequent deep anesthesia, a 22 gauge spinal needle was advanced
into the right SI joint under intermittent CT guidance.

Once the needle was in satisfactory position, representative image
was captured with the needle demonstrated in the sacroiliac joint.
Subsequently, 40 mg Kenalog and 3 mL Ropivacaine 0.5% was injected
into the right SI joint. Needle was removed.

Subsequently attention was made to the left sacroiliac joint.

After local anesthesia with 1% lidocaine without epinephrine and
subsequent deep anesthesia, a 22 gauge spinal needle was advanced
into the left SI joint under intermittent CT guidance.

Once the needle was in satisfactory position, representative image
was captured with the needle demonstrated in the sacroiliac joint.
Subsequently, 40 mg Kenalog and 3 mL Ropivacaine 0.5% was injected
into the left SI joint. Needle was removed.

Hemostasis was achieved. No complications were observed. Patient
tolerated the procedure well.
IMPRESSION: 1. Successful CT-guided right SI joint injection.
2. Successful CT-guided left SI joint injection.

## 2018-09-27 IMAGING — CT CT GUIDANCE NEEDLE PLACEMENT
2 series · 11 of 16 positions shown, 12 images · non-contrast
Comparison: none

CLINICAL DATA: Bilateral sacroiliitis.

[Series 2: i-spiral 5.0 b30f · axial · 0.77mm/px · z∈[+414,+464]mm · 3 of 30 slices shown, 4 images]
[im 8/30  soft-tissue]
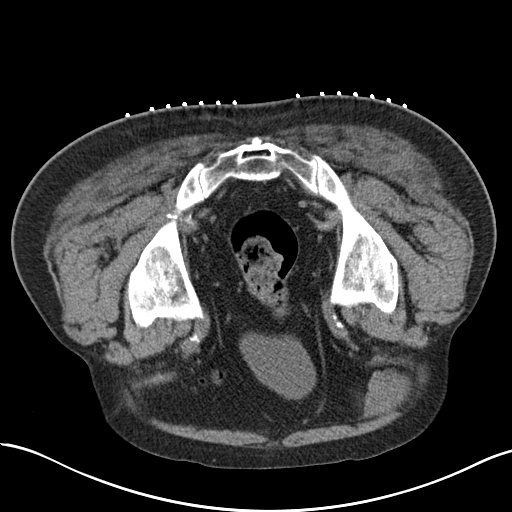
[im 8/30  bone]
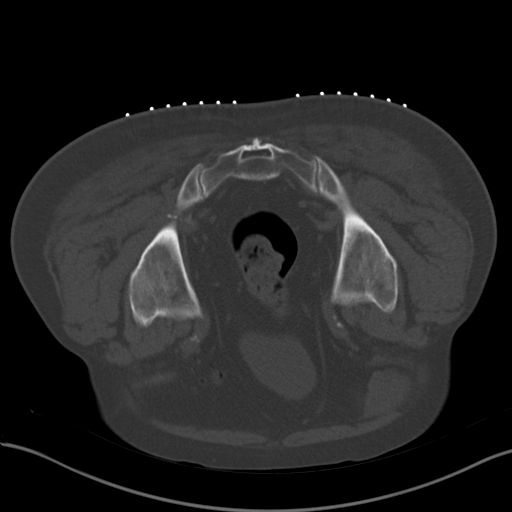
[im 15/30  soft-tissue]
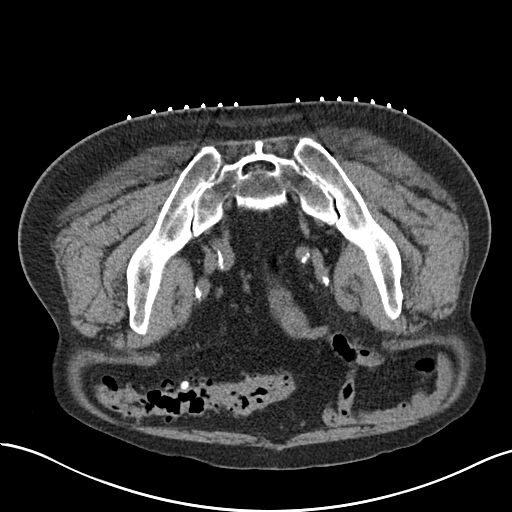
[im 22/30  soft-tissue]
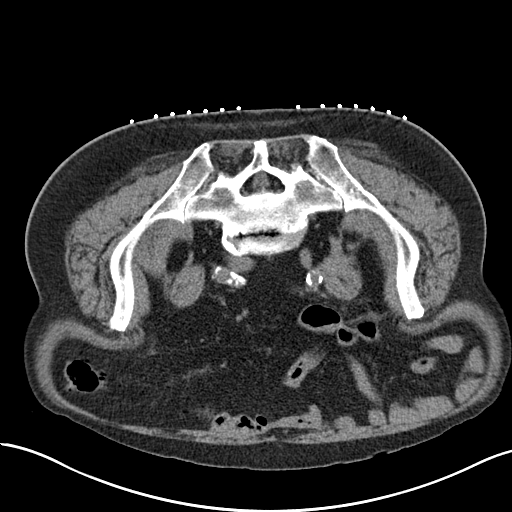

[Series 3: i-sequence 4.8 b30s · axial · 0.77mm/px · z∈[+405,+414]mm · 8 of 60 slices shown]
[im 6/60  soft-tissue]
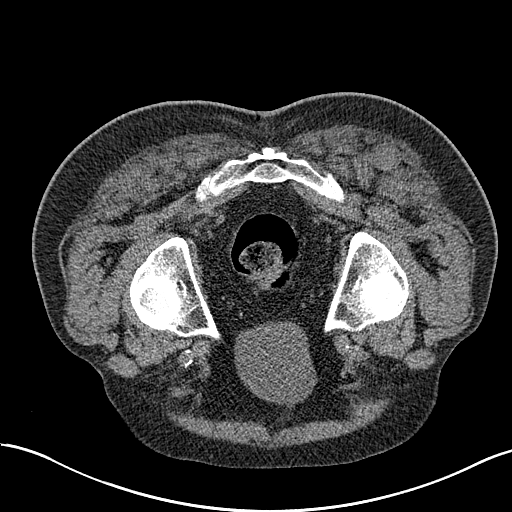
[im 12/60  soft-tissue]
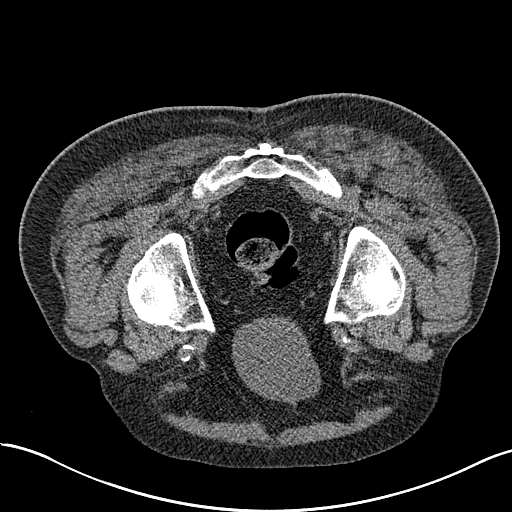
[im 18/60  soft-tissue]
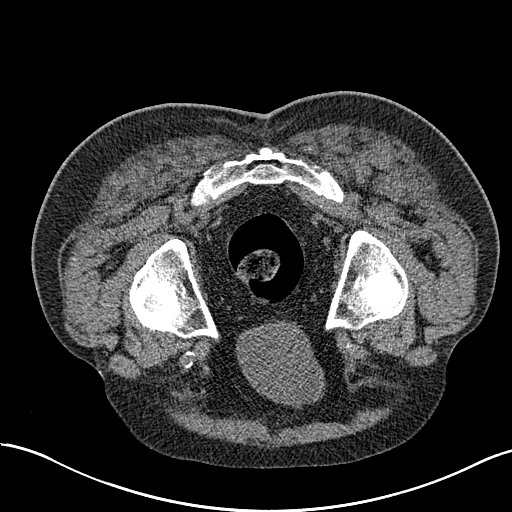
[im 24/60  soft-tissue]
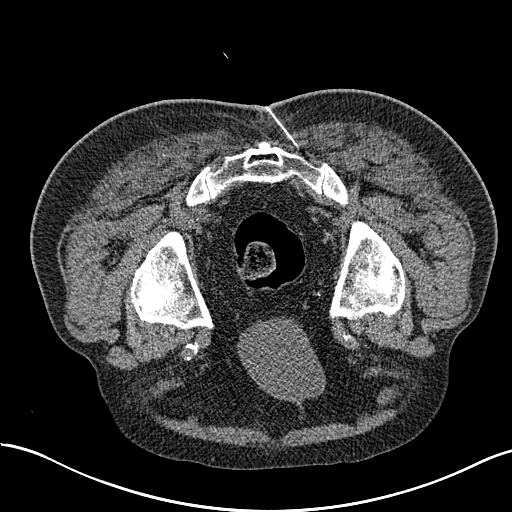
[im 36/60  soft-tissue]
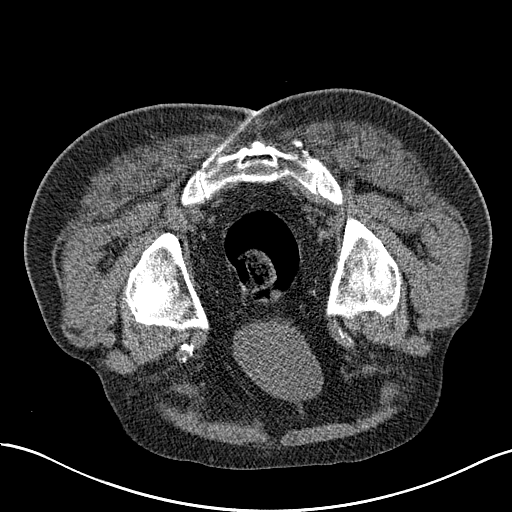
[im 42/60  soft-tissue]
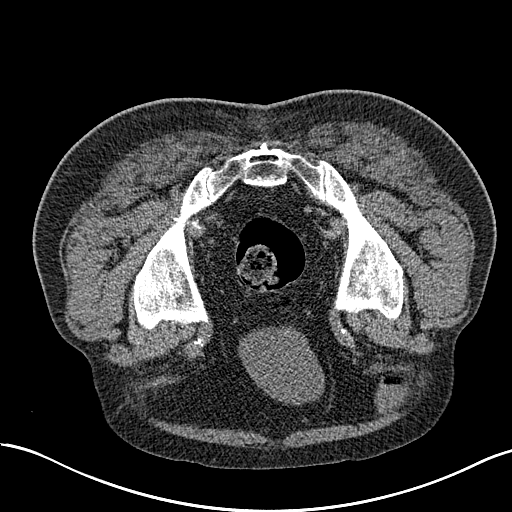
[im 48/60  soft-tissue]
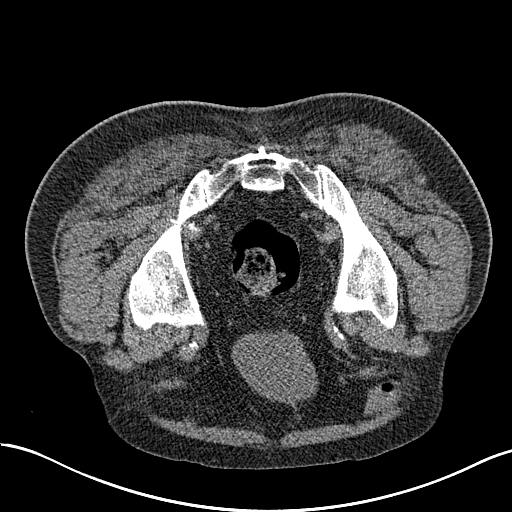
[im 54/60  soft-tissue]
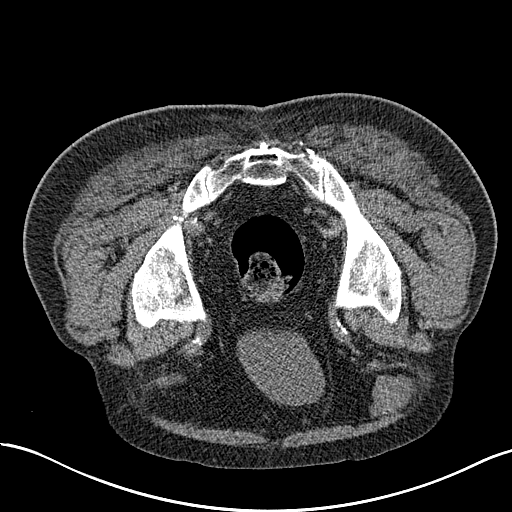

[11 of 16 positions shown; findings below may reference images not displayed]

EXAM:
RIGHT CT GUIDED SI JOINT INJECTION

LEFT CT GUIDED SI JOINT INJECTION



After local anesthesia with 1% lidocaine without epinephrine and
subsequent deep anesthesia, a 22 gauge spinal needle was advanced
into the right SI joint under intermittent CT guidance.

Once the needle was in satisfactory position, representative image
was captured with the needle demonstrated in the sacroiliac joint.
Subsequently, 40 mg Kenalog and 1 mL Ropivacaine 0.5% was injected
into the right SI joint. Needle was removed.

Subsequently attention was made to the left sacroiliac joint.

After local anesthesia with 1% lidocaine without epinephrine and
subsequent deep anesthesia, a 22 gauge spinal needle was advanced
into the left SI joint under intermittent CT guidance.

Once the needle was in satisfactory position, representative image
was captured with the needle demonstrated in the sacroiliac joint.
Subsequently, 40 mg Kenalog and 1 mL Ropivacaine 0.5% was injected
into the left SI joint. Needle was removed.

Hemostasis was achieved. No complications were observed. Patient
tolerated the procedure well.
IMPRESSION: 1. Successful CT-guided right SI joint injection.
2. Successful CT-guided left SI joint injection.

## 2023-08-30 ENCOUNTER — Other Ambulatory Visit: Payer: Self-pay

## 2023-08-30 ENCOUNTER — Emergency Department
Admission: EM | Admit: 2023-08-30 | Discharge: 2023-08-30 | Disposition: A | Discharge status: Home / Self Care | Payer: Medicare Other | Attending: Emergency Medicine | Admitting: Emergency Medicine

## 2023-08-30 ENCOUNTER — Emergency Department: Payer: Medicare Other

## 2023-08-30 DIAGNOSIS — J449 Chronic obstructive pulmonary disease, unspecified: Secondary | ICD-10-CM | POA: Insufficient documentation

## 2023-08-30 DIAGNOSIS — E119 Type 2 diabetes mellitus without complications: Secondary | ICD-10-CM | POA: Diagnosis not present

## 2023-08-30 DIAGNOSIS — R7989 Other specified abnormal findings of blood chemistry: Secondary | ICD-10-CM | POA: Insufficient documentation

## 2023-08-30 DIAGNOSIS — Z7901 Long term (current) use of anticoagulants: Secondary | ICD-10-CM | POA: Diagnosis not present

## 2023-08-30 DIAGNOSIS — R0602 Shortness of breath: Secondary | ICD-10-CM | POA: Insufficient documentation

## 2023-08-30 DIAGNOSIS — I509 Heart failure, unspecified: Secondary | ICD-10-CM | POA: Diagnosis not present

## 2023-08-30 DIAGNOSIS — R600 Localized edema: Secondary | ICD-10-CM | POA: Insufficient documentation

## 2023-08-30 DIAGNOSIS — I2510 Atherosclerotic heart disease of native coronary artery without angina pectoris: Secondary | ICD-10-CM | POA: Diagnosis not present

## 2023-08-30 DIAGNOSIS — I110 Hypertensive heart disease with heart failure: Secondary | ICD-10-CM | POA: Insufficient documentation

## 2023-08-30 LAB — CBC WITH DIFFERENTIAL/PLATELET
Abs Immature Granulocytes: 0.02 10*3/uL (ref 0.00–0.07)
Basophils Absolute: 0 10*3/uL (ref 0.0–0.1)
Basophils Relative: 0 %
Eosinophils Absolute: 0 10*3/uL (ref 0.0–0.5)
Eosinophils Relative: 0 %
HCT: 31.9 % — ABNORMAL LOW (ref 39.0–52.0)
Hemoglobin: 9.4 g/dL — ABNORMAL LOW (ref 13.0–17.0)
Immature Granulocytes: 1 %
Lymphocytes Relative: 23 %
Lymphs Abs: 0.8 10*3/uL (ref 0.7–4.0)
MCH: 24.7 pg — ABNORMAL LOW (ref 26.0–34.0)
MCHC: 29.5 g/dL — ABNORMAL LOW (ref 30.0–36.0)
MCV: 83.7 fL (ref 80.0–100.0)
Monocytes Absolute: 0.3 10*3/uL (ref 0.1–1.0)
Monocytes Relative: 9 %
Neutro Abs: 2.1 10*3/uL (ref 1.7–7.7)
Neutrophils Relative %: 67 %
Platelets: 147 10*3/uL — ABNORMAL LOW (ref 150–400)
RBC: 3.81 MIL/uL — ABNORMAL LOW (ref 4.22–5.81)
RDW: 20 % — ABNORMAL HIGH (ref 11.5–15.5)
WBC: 3.2 10*3/uL — ABNORMAL LOW (ref 4.0–10.5)
nRBC: 0 % (ref 0.0–0.2)

## 2023-08-30 LAB — BASIC METABOLIC PANEL
Anion gap: 13 (ref 5–15)
BUN: 33 mg/dL — ABNORMAL HIGH (ref 8–23)
CO2: 20 mmol/L — ABNORMAL LOW (ref 22–32)
Calcium: 8.1 mg/dL — ABNORMAL LOW (ref 8.9–10.3)
Chloride: 104 mmol/L (ref 98–111)
Creatinine, Ser: 1.43 mg/dL — ABNORMAL HIGH (ref 0.61–1.24)
GFR, Estimated: 46 mL/min — ABNORMAL LOW (ref >60)
Glucose, Bld: 110 mg/dL — ABNORMAL HIGH (ref 70–99)
Potassium: 4.4 mmol/L (ref 3.5–5.1)
Sodium: 137 mmol/L (ref 135–145)

## 2023-08-30 LAB — BRAIN NATRIURETIC PEPTIDE: B Natriuretic Peptide: 1206.1 pg/mL — ABNORMAL HIGH (ref 0.0–100.0)

## 2023-08-30 MED ORDER — AMOXICILLIN-POT CLAVULANATE 875-125 MG PO TABS: 1.0000 | ORAL_TABLET | Freq: Two times a day (BID) | ORAL | 0 refills | Status: AC

## 2023-08-30 MED ORDER — FUROSEMIDE 20 MG PO TABS: 20.0000 mg | ORAL_TABLET | Freq: Every day | ORAL | 0 refills | Status: DC

## 2023-08-30 MED ORDER — PREDNISONE 20 MG PO TABS: 40.0000 mg | ORAL_TABLET | Freq: Every day | ORAL | 0 refills | Status: AC

## 2023-08-30 MED ORDER — FUROSEMIDE 40 MG PO TABS
20.0000 mg | ORAL_TABLET | Freq: Once | ORAL | Status: AC
  Administered 2023-08-30: 20 mg via ORAL
  Filled 2023-08-30: qty 1

## 2023-08-30 NOTE — ED Provider Notes (Signed)
[facility] Provider Note    Event Date/Time   First MD Initiated Contact with Patient 08/30/23 1816     (approximate)   History   Shortness of Breath   HPI  Alan Barrett is a 89 year old male with history of COPD, T2DM, HTN, CAD, A-fib on warfarin presenting to the emergency department for evaluation of shortness of breath and leg swelling.  Over the past several weeks, patient has noticed some worsening lower extremity edema with generalized weakness and shortness of breath most notably when up and walking around.  No chest pain.  Has previously seen a cardiologist, but is not seeing them in several years.  No known history of heart failure.  No history of similar symptoms.  Has had worsening cough recently with some wheezing, currently reports improved.      Physical Exam   Triage Vital Signs: ED Triage Vitals  Encounter Vitals Group     BP 08/30/23 1612 127/74     Systolic BP Percentile --      Diastolic BP Percentile --      Pulse Rate 08/30/23 1612 72     Resp 08/30/23 1612 17     Temp 08/30/23 1612 (!) 97.4 F (36.3 C)     Temp Source 08/30/23 1612 Oral     SpO2 08/30/23 1612 98 %     Weight 08/30/23 1614 180 lb (81.6 kg)     Height 08/30/23 1614 5\' 8"  (1.727 m)     Head Circumference --      Peak Flow --      Pain Score 08/30/23 1613 0     Pain Loc --      Pain Education --      Exclude from Growth Chart --     Most recent vital signs: Vitals:   08/30/23 1612 08/30/23 1907  BP: 127/74 129/84  Pulse: 72 73  Resp: 17 (!) 33  Temp: (!) 97.4 F (36.3 C)   SpO2: 98% 94%     General: Awake, interactive  CV:  Regular rate, good peripheral perfusion.  Resp:  Respirations unlabored, bibasilar crackles, lungs otherwise clear to auscultation without appreciable wheezing. Abd:  Nondistended.  Neuro:  Symmetric facial movement, fluid speech MSK:  Bilateral lower extremity pitting edema   ED Results / Procedures / Treatments    Labs (all labs ordered are listed, but only abnormal results are displayed) Labs Reviewed  CBC WITH DIFFERENTIAL/PLATELET - Abnormal; Notable for the following components:      Result Value   WBC 3.2 (*)    RBC 3.81 (*)    Hemoglobin 9.4 (*)    HCT 31.9 (*)    MCH 24.7 (*)    MCHC 29.5 (*)    RDW 20.0 (*)    Platelets 147 (*)    All other components within normal limits  BASIC METABOLIC PANEL - Abnormal; Notable for the following components:   CO2 20 (*)    Glucose, Bld 110 (*)    BUN 33 (*)    Creatinine, Ser 1.43 (*)    Calcium 8.1 (*)    GFR, Estimated 46 (*)    All other components within normal limits  BRAIN NATRIURETIC PEPTIDE - Abnormal; Notable for the following components:   B Natriuretic Peptide 1,206.1 (*)    All other components within normal limits     EKG EKG independently reviewed interpreted by myself (ER attending) demonstrates:  EKG demonstrates A-fib at a rate of  68, QRS 82, QTc 429, no acute ST changes  RADIOLOGY Imaging independently reviewed and interpreted by myself demonstrates:  CXR with diffuse infiltrates concerning for pulmonary edema.  No focal consolidation.  PROCEDURES:  Critical Care performed: No  Procedures   MEDICATIONS ORDERED IN ED: Medications  furosemide (LASIX) tablet 20 mg (20 mg Oral Given 08/30/23 1908)     IMPRESSION / MDM / ASSESSMENT AND PLAN / ED COURSE  I reviewed the triage vital signs and the nursing notes.  Differential diagnosis includes, but is not limited to, new onset CHF, pneumonia, pneumothorax, COPD exacerbation  Patient's presentation is most consistent with acute presentation with potential threat to life or bodily function.  89 year old male presenting to the emergency department for evaluation of shortness of breath.  Vital stable on presentation.  Noted to have intermittent tachypnea here. Labs with mild leukopenia, anemia but no reported acute bleeding sources.  BMP with mild creatinine  elevation at 1.43, no recent prior for comparison.  BNP significantly elevated at 1200.  X-Kemberly Taves also concerning for edema.  Suspect likely new onset CHF.  Fortunately without hypoxia here.  However, with his shortness of breath and tachypnea, I did have multiple conversations about the patient about admission.  Patient strongly prefers to be discharged home.  Wife and daughter are agreeable with this plan. Will place referral for cardiology.  Given dose of Lasix here.  Will DC with Lasix as well as steroids and antibiotics given possible COPD flare placed on history.  Strict return precautions provided.  Patient discharged stable condition.     FINAL CLINICAL IMPRESSION(S) / ED DIAGNOSES   Final diagnoses:  New onset of congestive heart failure (HCC)  Shortness of breath     Rx / DC Orders   ED Discharge Orders          Ordered    Ambulatory referral to Cardiology       Comments: If you have not heard from the Cardiology office within the next 72 hours please call [phone].   08/30/23 2000             Note:  This document was prepared using Dragon voice recognition software and may include unintentional dictation errors.   Trinna Post, MD 08/30/23 2001

## 2023-08-30 NOTE — Discharge Instructions (Addendum)
You were seen in the emergency room today for evaluation of your leg swelling and shortness of breath.  I suspect you likely have poor squeeze of your heart known as congestive heart failure.  I have sent a prescription for a medication to help get fluid off of your body called Lasix.  Please take this for the next few days until you are able to arrange follow-up with cardiology.  I have placed a referral to cardiology.  You should be contacted by them, but if you have not heard from them in a few days, please call the number below.  I have also sent a prescription for some antibiotics and steroids for suspected COPD flare. Return to the ER for new or worsening symptoms including worsening shortness of breath, chest pain, or any other new or concerning symptoms.

## 2023-08-30 NOTE — ED Triage Notes (Signed)
Difficulty breathing x 1 month, worse over the last week. Generalized weakness and dyspnea on exertion over the last few days. Pitting edema to BLE.

## 2023-08-30 NOTE — ED Provider Triage Note (Signed)
Emergency Medicine Provider Triage Evaluation Note  Alan Barrett , a 89 y.o. male  was evaluated in triage.  Pt complains of SOB for a month that has worsened recently in the last week. He cannot walk very far without getting SOB. Also complains of leg swelling.   Review of Systems  Positive: SOB, leg swelling Negative: CP  Physical Exam  There were no vitals taken for this visit. Gen:   Awake, no distress   Resp:  Normal effort  MSK:   Moves extremities without difficulty  Other:  2+ Pitting edema bilaterally up to the knees  Medical Decision Making  Medically screening exam initiated at 4:10 PM.  Appropriate orders placed.  Elodia Florence was informed that the remainder of the evaluation will be completed by another provider, this initial triage assessment does not replace that evaluation, and the importance of remaining in the ED until their evaluation is complete.    Cameron Ali, PA-C 08/30/23 1614

## 2023-09-26 ENCOUNTER — Encounter: Payer: Self-pay | Admitting: Cardiology

## 2023-09-26 ENCOUNTER — Other Ambulatory Visit: Payer: Self-pay

## 2023-09-26 ENCOUNTER — Ambulatory Visit: Payer: Medicare Other | Attending: Cardiology | Admitting: Cardiology

## 2023-09-26 VITALS — BP 104/70 | HR 80 | Ht 68.0 in | Wt 165.0 lb

## 2023-09-26 DIAGNOSIS — I4821 Permanent atrial fibrillation: Secondary | ICD-10-CM

## 2023-09-26 DIAGNOSIS — I2510 Atherosclerotic heart disease of native coronary artery without angina pectoris: Secondary | ICD-10-CM | POA: Diagnosis not present

## 2023-09-26 DIAGNOSIS — C61 Malignant neoplasm of prostate: Secondary | ICD-10-CM | POA: Insufficient documentation

## 2023-09-26 DIAGNOSIS — I10 Essential (primary) hypertension: Secondary | ICD-10-CM

## 2023-09-26 DIAGNOSIS — R0609 Other forms of dyspnea: Secondary | ICD-10-CM

## 2023-09-26 DIAGNOSIS — K219 Gastro-esophageal reflux disease without esophagitis: Secondary | ICD-10-CM | POA: Insufficient documentation

## 2023-09-26 MED ORDER — TORSEMIDE 20 MG PO TABS: ORAL_TABLET | ORAL | 0 refills | Status: DC

## 2023-09-26 MED ORDER — SPIRONOLACTONE 25 MG PO TABS: 12.5000 mg | ORAL_TABLET | Freq: Every day | ORAL | 0 refills | Status: DC

## 2023-09-26 NOTE — Progress Notes (Signed)
Cardiology Office Note:    Date:  09/26/2023   ID:  Alan Barrett, DOB Aug 21, 1929, MRN [id number]  PCP:  Gracelyn Nurse, MD   [facility] Providers Cardiologist:  Debbe Odea, MD     Referring MD: Gracelyn Nurse, MD   Chief Complaint  Patient presents with   New Patient (Initial Visit)    Referred for cardiac evaluation of New onset of congestive heart failure.    History of Present Illness:    Tramond Slinker is a 89 y.o. male with a hx of CAD/PCI, permanent atrial fibrillation on Coumadin, hypertension, hyperlipidemia, COPD who presents with worsening shortness of breath and leg edema.  Patient states having shortness of breath with exertion ongoing over the past 2 months.  Also noticed leg swelling.  Presented to the ED, was given Lasix.  Overall symptoms have largely stayed the same.  He states having a stent to the heart over 10 to 15 years ago.  Procedure performed by Dr. Lady Gary who is now retired.  Denies orthopnea, denies palpitations.  Takes Coumadin due to A-fib, INR being monitored at [facility] clinic.   Echocardiogram 2017 EF 50 to 55%  Past Medical History:  Diagnosis Date   Atrial fibrillation (HCC)    Cancer (HCC)    CHF (congestive heart failure) (HCC)    COPD (chronic obstructive pulmonary disease) (HCC)    Coronary artery disease    Diabetes mellitus without complication (HCC)    GERD (gastroesophageal reflux disease)    Hyperlipemia    Hypertension     Past Surgical History:  Procedure Laterality Date   PROSTATE SURGERY     STENT PLACEMENT VASCULAR ([facility] HX)     TONSILLECTOMY      Current Medications: Current Meds  Medication Sig   atorvastatin (LIPITOR) 40 MG tablet Take 40 mg by mouth daily.   chlorpheniramine-HYDROcodone (TUSSIONEX) 10-8 MG/5ML SUER Take 5 mLs by mouth every 12 (twelve) hours as needed for cough.   guaiFENesin (MUCINEX) 600 MG 12 hr tablet Take 1 tablet (600 mg total) by mouth 2 (two) times daily.    ketoconazole (NIZORAL) 2 % shampoo Apply 1 application topically 3 (three) times a week.   latanoprost (XALATAN) 0.005 % ophthalmic solution Place 1 drop into both eyes at bedtime.   metFORMIN (GLUCOPHAGE) 500 MG tablet Take 500 mg by mouth 2 (two) times daily.   metoprolol tartrate (LOPRESSOR) 25 MG tablet Take 25 mg by mouth 2 (two) times daily.   omeprazole (PRILOSEC) 20 MG capsule Take 20 mg by mouth daily.   PROAIR HFA 108 (90 Base) MCG/ACT inhaler Take 2 puffs by mouth every 4 (four) hours as needed.   spironolactone (ALDACTONE) 25 MG tablet Take 0.5 tablets (12.5 mg total) by mouth daily.   torsemide (DEMADEX) 20 MG tablet take one tablet (20mg ) by mouth daily. Please take an extra pill for weight gain of 3lbs in one day or 5lbs in one week   warfarin (COUMADIN) 5 MG tablet Take 5 mg by mouth every evening. Pt is currently on coumadin 6 mg Th and 4 mg all other days.   [DISCONTINUED] amLODipine (NORVASC) 5 MG tablet Take 5 mg by mouth daily.   [DISCONTINUED] aspirin EC 81 MG tablet Take 81 mg by mouth daily.   [DISCONTINUED] furosemide (LASIX) 20 MG tablet Take 1 tablet (20 mg total) by mouth daily.   [DISCONTINUED] hydrochlorothiazide (HYDRODIURIL) 12.5 MG tablet Take 25 mg by mouth daily.  Allergies:   Niacin er (antihyperlipidemic)   Social History   Socioeconomic History   Marital status: Married    Spouse name: Not on file   Number of children: Not on file   Years of education: Not on file   Highest education level: Not on file  Occupational History   Not on file  Tobacco Use   Smoking status: Former   Smokeless tobacco: Never  Vaping Use   Vaping status: Never Used  Substance and Sexual Activity   Alcohol use: Never   Drug use: Never   Sexual activity: Not on file  Other Topics Concern   Not on file  Social History Narrative   Not on file   Social Drivers of Health   Financial Resource Strain: Not on file  Food Insecurity: Not on file  Transportation  Needs: Not on file  Physical Activity: Not on file  Stress: Not on file  Social Connections: Not on file     Family History: The patient's family history includes Heart disease in his brother and sister.  ROS:   Please see the history of present illness.     All other systems reviewed and are negative.  EKGs/Labs/Other Studies Reviewed:    The following studies were reviewed today:  EKG Interpretation Date/Time:  Friday September 26 2023 09:38:28 EST Ventricular Rate:  80 PR Interval:    QRS Duration:  88 QT Interval:  390 QTC Calculation: 449 R Axis:   -65  Text Interpretation: Atrial fibrillation with premature ventricular or aberrantly conducted complexes Left axis deviation Pulmonary disease pattern Nonspecific T wave abnormality Confirmed by Debbe Odea ([id number]) on 09/26/2023 10:26:33 AM    Recent Labs: 08/30/2023: B Natriuretic Peptide 1,206.1; BUN 33; Creatinine, Ser 1.43; Hemoglobin 9.4; Platelets 147; Potassium 4.4; Sodium 137  Recent Lipid Panel No results found for: "CHOL", "TRIG", "HDL", "CHOLHDL", "VLDL", "LDLCALC", "LDLDIRECT"   Risk Assessment/Calculations:             Physical Exam:    VS:  BP 104/70 (BP Location: Left Arm, Patient Position: Sitting, Cuff Size: Normal)   Pulse 80   Ht 5\' 8"  (1.727 m)   Wt 165 lb (74.8 kg)   SpO2 96%   BMI 25.09 kg/m     Wt Readings from Last 3 Encounters:  09/26/23 165 lb (74.8 kg)  08/30/23 180 lb (81.6 kg)  05/23/16 198 lb 11.2 oz (90.1 kg)     GEN:  Well nourished, well developed in no acute distress HEENT: Normal NECK: No JVD; No carotid bruits CARDIAC: Irregular irregular RESPIRATORY: Minich breath sounds bilaterally ABDOMEN: Soft, non-tender, distended MUSCULOSKELETAL:  2+ edema; No deformity  SKIN: Warm and dry NEUROLOGIC:  Alert and oriented x 3 PSYCHIATRIC:  Normal affect   ASSESSMENT:    1. Dyspnea on exertion   2. Permanent atrial fibrillation (HCC)   3. Primary hypertension   4.  Coronary artery disease involving native coronary artery of native heart, unspecified whether angina present    PLAN:    In order of problems listed above:  Dyspnea on exertion, leg edema.  Symptoms consistent with NYHA class III heart failure.  Not adequate diuresing with Lasix.  Start torsemide 20 mg daily, start Aldactone 12.5 mg daily.  Check BMP in 10 days.  Obtain echocardiogram.  Consider additional GDMT based on EF. Permanent A-fib, heart rate controlled.  Continue Lopressor, Coumadin. Hypertension, BP controlled.  Stop Norvasc, stop HCTZ with starting torsemide and Aldactone. History of CAD/PCI.  Continue warfarin,  Lipitor.  Follow-up after cardiac testing     Medication Adjustments/Labs and Tests Ordered: Current medicines are reviewed at length with the patient today.  Concerns regarding medicines are outlined above.  Orders Placed This Encounter  Procedures   Basic Metabolic Panel (BMET)   EKG 12-Lead   ECHOCARDIOGRAM COMPLETE   Meds ordered this encounter  Medications   torsemide (DEMADEX) 20 MG tablet    Sig: take one tablet (20mg ) by mouth daily. Please take an extra pill for weight gain of 3lbs in one day or 5lbs in one week    Dispense:  30 tablet    Refill:  0   spironolactone (ALDACTONE) 25 MG tablet    Sig: Take 0.5 tablets (12.5 mg total) by mouth daily.    Dispense:  15 tablet    Refill:  0    Patient Instructions  Medication Instructions:   STOP Amlodipine STOP hydrochlorothiazide STOP Aspirin STOP Lasix START torsemide - take one tablet (20mg ) by mouth daily. Please take an extra pill for weight gain of 3lbs in one day or 5lbs in one week 5. START Spirolactone - Take half tablet ( 12.5mg ) daily.   *If you need a refill on your cardiac medications before your next appointment, please call your pharmacy*   Lab Work:  Your provider would like for you to return in 10/06/2023  to have the following labs drawn: BMP.   Please go to [facility] [address] ([facility]) [address], [place] [address] You do not need an appointment.  They are open from 8 am- 4:30 pm.  Lunch from 1:00 pm- 2:00 pm You WILL NOT need to be fasting.   You may also go to one of the following [facility]:  [address] [place], [place] [address] Phone: [phone] Lab hours: Mon-Fri 8 am- 5 pm    Lunch 12 pm- 1 pm  [address] [place],  [place]  [address]  [place] Phone: [phone] Lab hours: 7 am- 4 pm Lunch 12 pm-1 pm   [address] [place],  [place]  [address]  [place] Phone: [phone] Lab hours: Mon-Fri 8 am- 5 pm    Lunch 12 pm- 1 pm  If you have labs (blood work) drawn today and your tests are completely normal, you will receive your results only by: MyChart Message (if you have MyChart) OR A paper copy in the mail If you have any lab test that is abnormal or we need to change your treatment, we will call you to review the results.   Testing/Procedures:  Your physician has requested that you have an echocardiogram. Echocardiography is a painless test that uses sound waves to create images of your heart. It provides your doctor with information about the size and shape of your heart and how well your heart's chambers and valves are working. This procedure takes approximately one hour. There are no restrictions for this procedure. Please do NOT wear cologne, perfume, aftershave, or lotions (deodorant is allowed). Please arrive 15 minutes prior to your appointment time.  Please note: We ask at that you not bring children with you during ultrasound (echo/ vascular) testing. Due to room size and safety concerns, children are not allowed in the ultrasound rooms during exams. Our front office staff cannot provide observation of children in our lobby area while testing is being conducted. An adult accompanying a patient to their appointment will only be allowed in the ultrasound room at the discretion of the ultrasound  technician  under special circumstances. We apologize for any inconvenience.    Follow-Up: At [facility], you and your health needs are our priority.  As part of our continuing mission to provide you with exceptional heart care, we have created designated Provider Care Teams.  These Care Teams include your primary Cardiologist (physician) and Advanced Practice Providers (APPs -  Physician Assistants and Nurse Practitioners) who all work together to provide you with the care you need, when you need it.  We recommend signing up for the patient portal called "MyChart".  Sign up information is provided on this After Visit Summary.  MyChart is used to connect with patients for Virtual Visits (Telemedicine).  Patients are able to view lab/test results, encounter notes, upcoming appointments, etc.  Non-urgent messages can be sent to your provider as well.   To learn more about what you can do with MyChart, go to [email or web address].    Your next appointment:    After Echocardiogram   Provider:   Debbe Odea, MD ONLY      Signed, Debbe Odea, MD  09/26/2023 10:57 AM    [facility]

## 2023-09-26 NOTE — Patient Instructions (Addendum)
Medication Instructions:   STOP Amlodipine STOP hydrochlorothiazide STOP Aspirin STOP Lasix START torsemide - take one tablet (20mg ) by mouth daily. Please take an extra pill for weight gain of 3lbs in one day or 5lbs in one week 5. START Spirolactone - Take half tablet ( 12.5mg ) daily.   *If you need a refill on your cardiac medications before your next appointment, please call your pharmacy*   Lab Work:  Your provider would like for you to return in 10/06/2023  to have the following labs drawn: BMP.   Please go to [facility] [address] ([facility]) [address], [place] [address] You do not need an appointment.  They are open from 8 am- 4:30 pm.  Lunch from 1:00 pm- 2:00 pm You WILL NOT need to be fasting.   You may also go to one of the following [facility]:  [address] [place], [place] [address] Phone: [phone] Lab hours: Mon-Fri 8 am- 5 pm    Lunch 12 pm- 1 pm  [address] [place],  [place]  [address]  [place] Phone: [phone] Lab hours: 7 am- 4 pm Lunch 12 pm-1 pm   [address] [place],  [place]  [address]  [place] Phone: [phone] Lab hours: Mon-Fri 8 am- 5 pm    Lunch 12 pm- 1 pm  If you have labs (blood work) drawn today and your tests are completely normal, you will receive your results only by: MyChart Message (if you have MyChart) OR A paper copy in the mail If you have any lab test that is abnormal or we need to change your treatment, we will call you to review the results.   Testing/Procedures:  Your physician has requested that you have an echocardiogram. Echocardiography is a painless test that uses sound waves to create images of your heart. It provides your doctor with information about the size and shape of your heart and how well your heart's chambers and valves are working. This procedure takes approximately one hour. There are no restrictions for this procedure. Please do NOT wear cologne, perfume, aftershave, or lotions  (deodorant is allowed). Please arrive 15 minutes prior to your appointment time.  Please note: We ask at that you not bring children with you during ultrasound (echo/ vascular) testing. Due to room size and safety concerns, children are not allowed in the ultrasound rooms during exams. Our front office staff cannot provide observation of children in our lobby area while testing is being conducted. An adult accompanying a patient to their appointment will only be allowed in the ultrasound room at the discretion of the ultrasound technician under special circumstances. We apologize for any inconvenience.    Follow-Up: At [facility], you and your health needs are our priority.  As part of our continuing mission to provide you with exceptional heart care, we have created designated Provider Care Teams.  These Care Teams include your primary Cardiologist (physician) and Advanced Practice Providers (APPs -  Physician Assistants and Nurse Practitioners) who all work together to provide you with the care you need, when you need it.  We recommend signing up for the patient portal called "MyChart".  Sign up information is provided on this After Visit Summary.  MyChart is used to connect with patients for Virtual Visits (Telemedicine).  Patients are able to view lab/test results, encounter notes, upcoming appointments, etc.  Non-urgent messages can be sent to your provider as well.   To learn more about what you can do  with MyChart, go to [email or web address].    Your next appointment:    After Echocardiogram   Provider:   Debbe Odea, MD ONLY

## 2023-10-07 LAB — BASIC METABOLIC PANEL
BUN/Creatinine Ratio: 21 (ref 10–24)
BUN: 32 mg/dL (ref 10–36)
CO2: 22 mmol/L (ref 20–29)
Calcium: 8.1 mg/dL — ABNORMAL LOW (ref 8.6–10.2)
Chloride: 103 mmol/L (ref 96–106)
Creatinine, Ser: 1.51 mg/dL — ABNORMAL HIGH (ref 0.76–1.27)
Glucose: 180 mg/dL — ABNORMAL HIGH (ref 70–99)
Potassium: 4.1 mmol/L (ref 3.5–5.2)
Sodium: 141 mmol/L (ref 134–144)
eGFR: 43 mL/min/{1.73_m2} — ABNORMAL LOW (ref >59)

## 2023-10-13 ENCOUNTER — Other Ambulatory Visit: Payer: Self-pay

## 2023-10-13 DIAGNOSIS — N289 Disorder of kidney and ureter, unspecified: Secondary | ICD-10-CM

## 2023-10-15 ENCOUNTER — Ambulatory Visit: Payer: Medicare Other | Attending: Cardiology

## 2023-10-15 DIAGNOSIS — R0609 Other forms of dyspnea: Secondary | ICD-10-CM | POA: Diagnosis not present

## 2023-10-20 LAB — ECHOCARDIOGRAM COMPLETE
AV Mean grad: 3 mm[Hg]
AV Peak grad: 6.2 mm[Hg]
Ao pk vel: 1.24 m/s
Area-P 1/2: 4.89 cm2
MV M vel: 5.24 m/s
MV Peak grad: 109.8 mm[Hg]
Radius: 0.4 cm
S' Lateral: 3.5 cm

## 2023-10-21 ENCOUNTER — Other Ambulatory Visit: Payer: Self-pay

## 2023-10-21 ENCOUNTER — Telehealth: Payer: Self-pay | Admitting: Family

## 2023-10-21 DIAGNOSIS — I5020 Unspecified systolic (congestive) heart failure: Secondary | ICD-10-CM

## 2023-10-21 MED ORDER — TORSEMIDE 40 MG PO TABS: 40.0000 mg | ORAL_TABLET | Freq: Every day | ORAL | 0 refills | Status: DC

## 2023-10-21 NOTE — Telephone Encounter (Signed)
 Called pt to schedule referral, lvm

## 2023-10-23 ENCOUNTER — Telehealth: Payer: Self-pay | Admitting: Cardiology

## 2023-10-23 ENCOUNTER — Other Ambulatory Visit: Payer: Self-pay

## 2023-10-23 MED ORDER — TORSEMIDE 20 MG PO TABS: 40.0000 mg | ORAL_TABLET | Freq: Every day | ORAL | 0 refills | Status: DC

## 2023-10-23 NOTE — Telephone Encounter (Signed)
 Called patient spoke with wife, advised that her husband has had an increase in weight gain. He has had swelling located in bilateral legs/feet. She does mention the skin was red around the feet as well. No new SOB per wife. He takes 0.5 tablet (12.5 mg) of spironolactone, takes 20 mg tablet of torsemide. They checked his weight this morning and it was about 2.5 increase overnight. I advised per note okay to take extra torsemide if weight gain of 3 lbs in a day, and 5 lbs in a week. Patient advised they would re weigh and take the extra torsemide tablet. Also aware per lab work results patient had blood work on 02/03, they needed repeat BMET in 10 days, they will try to come tomorrow, or early next week to have this completed.   Advised I would route to MD to make aware. Patient wife verbalized understanding.

## 2023-10-23 NOTE — Telephone Encounter (Signed)
 Pt c/o swelling/edema: STAT if pt has developed SOB within 24 hours  If swelling, where is the swelling located?   Both feet  How much weight have you gained and in what time span?   2.5 pounds over night  Have you gained 2 pounds in a day or 5 pounds in a week? Yes  Do you have a log of your daily weights (if so, list)?   Yes  Are you currently taking a fluid pill?   Yes  Are you currently SOB?   When up and moving around  Have you traveled recently in a car or plane for an extended period of time?   No  Wife Britta Mccreedy) stated patient gained 2.5 pounds overnight and is concerned that he has been taking 1.5 fluid pills per day.  Wife wants a call back to discuss next steps.

## 2023-10-24 NOTE — Telephone Encounter (Signed)
 Called patient, spoke with wife.   Advised of message from MD.  They will increase torsemide, continue spironolactone, get blood work completed.   Does have upcoming appointment with HF team on March 3rd. Will notify MD as Lorain Childes. Thanks!

## 2023-10-25 LAB — BASIC METABOLIC PANEL
BUN/Creatinine Ratio: 20 (ref 10–24)
BUN: 35 mg/dL (ref 10–36)
CO2: 19 mmol/L — ABNORMAL LOW (ref 20–29)
Calcium: 8 mg/dL — ABNORMAL LOW (ref 8.6–10.2)
Chloride: 104 mmol/L (ref 96–106)
Creatinine, Ser: 1.77 mg/dL — ABNORMAL HIGH (ref 0.76–1.27)
Glucose: 129 mg/dL — ABNORMAL HIGH (ref 70–99)
Potassium: 5 mmol/L (ref 3.5–5.2)
Sodium: 142 mmol/L (ref 134–144)
eGFR: 36 mL/min/{1.73_m2} — ABNORMAL LOW (ref >59)

## 2023-10-27 ENCOUNTER — Other Ambulatory Visit: Payer: Self-pay

## 2023-10-30 ENCOUNTER — Other Ambulatory Visit: Payer: Self-pay | Admitting: Nephrology

## 2023-10-30 ENCOUNTER — Ambulatory Visit: Payer: Medicare Other | Attending: Cardiology | Admitting: Cardiology

## 2023-10-30 VITALS — BP 120/46 | HR 91 | Ht 68.0 in | Wt 185.0 lb

## 2023-10-30 DIAGNOSIS — I5020 Unspecified systolic (congestive) heart failure: Secondary | ICD-10-CM | POA: Diagnosis not present

## 2023-10-30 DIAGNOSIS — I2510 Atherosclerotic heart disease of native coronary artery without angina pectoris: Secondary | ICD-10-CM

## 2023-10-30 DIAGNOSIS — I10 Essential (primary) hypertension: Secondary | ICD-10-CM

## 2023-10-30 DIAGNOSIS — I4821 Permanent atrial fibrillation: Secondary | ICD-10-CM

## 2023-10-30 DIAGNOSIS — N1832 Chronic kidney disease, stage 3b: Secondary | ICD-10-CM

## 2023-10-30 MED ORDER — METOPROLOL SUCCINATE ER 25 MG PO TB24: 25.0000 mg | ORAL_TABLET | Freq: Every day | ORAL | 1 refills | Status: DC

## 2023-10-30 NOTE — Progress Notes (Signed)
 Cardiology Office Note:    Date:  10/30/2023   ID:  Alan Barrett, DOB [birth year], MRN [id number]  PCP:  Gracelyn Nurse, MD   [facility] Providers Cardiologist:  Debbe Odea, MD     Referring MD: Gracelyn Nurse, MD   No chief complaint on file.   History of Present Illness:    Alan Barrett is a 89 y.o. male with a hx of CAD/PCI (remote ?2010), permanent atrial fibrillation on Coumadin, hypertension, hyperlipidemia, COPD who presents with worsening shortness of breath and leg edema.  Previously seen with symptoms of leg edema, started on torsemide which was titrated to 40 mg daily.  Aldactone was also prescribed.  Repeat BMP 6 days ago showed worsening renal function.  Aldactone was stopped.  Overall he states edema is improving since starting torsemide.  Still endorses shortness of breath with exertion, denies chest pain.  Repeat echocardiogram 10/2023 showed EF 40 to 45%, moderate to severe MR.  Echocardiogram 2017 EF 50 to 55%  Past Medical History:  Diagnosis Date   Atrial fibrillation (HCC)    Cancer (HCC)    CHF (congestive heart failure) (HCC)    COPD (chronic obstructive pulmonary disease) (HCC)    Coronary artery disease    Diabetes mellitus without complication (HCC)    GERD (gastroesophageal reflux disease)    Hyperlipemia    Hypertension     Past Surgical History:  Procedure Laterality Date   PROSTATE SURGERY     STENT PLACEMENT VASCULAR ([facility] HX)     TONSILLECTOMY      Current Medications: Current Meds  Medication Sig   atorvastatin (LIPITOR) 40 MG tablet Take 40 mg by mouth daily.   chlorpheniramine-HYDROcodone (TUSSIONEX) 10-8 MG/5ML SUER Take 5 mLs by mouth every 12 (twelve) hours as needed for cough.   guaiFENesin (MUCINEX) 600 MG 12 hr tablet Take 1 tablet (600 mg total) by mouth 2 (two) times daily.   ipratropium-albuterol (DUONEB) 0.5-2.5 (3) MG/3ML SOLN Take 3 mLs by nebulization 4 (four) times daily.   ketoconazole  (NIZORAL) 2 % shampoo Apply 1 application topically 3 (three) times a week.   latanoprost (XALATAN) 0.005 % ophthalmic solution Place 1 drop into both eyes at bedtime.   metFORMIN (GLUCOPHAGE) 500 MG tablet Take 500 mg by mouth 2 (two) times daily.   metoprolol succinate (TOPROL-XL) 25 MG 24 hr tablet Take 1 tablet (25 mg total) by mouth daily.   omeprazole (PRILOSEC) 20 MG capsule Take 20 mg by mouth daily.   PROAIR HFA 108 (90 Base) MCG/ACT inhaler Take 2 puffs by mouth every 4 (four) hours as needed.   torsemide (DEMADEX) 20 MG tablet Take 2 tablets (40 mg total) by mouth daily.   warfarin (COUMADIN) 5 MG tablet Take 5 mg by mouth every evening. Pt is currently on coumadin 6 mg Th and 4 mg all other days.   [DISCONTINUED] metoprolol tartrate (LOPRESSOR) 25 MG tablet Take 25 mg by mouth 2 (two) times daily.     Allergies:   Niacin er (antihyperlipidemic)   Social History   Socioeconomic History   Marital status: Married    Spouse name: Not on file   Number of children: Not on file   Years of education: Not on file   Highest education level: Not on file  Occupational History   Not on file  Tobacco Use   Smoking status: Former   Smokeless tobacco: Never  Vaping Use   Vaping status: Never Used  Substance and Sexual Activity   Alcohol use: Never   Drug use: Never   Sexual activity: Not on file  Other Topics Concern   Not on file  Social History Narrative   Not on file   Social Drivers of Health   Financial Resource Strain: Not on file  Food Insecurity: Not on file  Transportation Needs: Not on file  Physical Activity: Not on file  Stress: Not on file  Social Connections: Not on file     Family History: The patient's family history includes Heart disease in his brother and sister.  ROS:   Please see the history of present illness.     All other systems reviewed and are negative.  EKGs/Labs/Other Studies Reviewed:    The following studies were reviewed  today:  EKG Interpretation Date/Time:  Thursday October 30 2023 11:31:14 EST Ventricular Rate:  91 PR Interval:    QRS Duration:  86 QT Interval:  368 QTC Calculation: 452 R Axis:   -36  Text Interpretation: Atrial fibrillation with premature ventricular or aberrantly conducted complexes Left axis deviation Pulmonary disease pattern Confirmed by Debbe Odea ([id number]) on 10/30/2023 11:53:57 AM    Recent Labs: 08/30/2023: B Natriuretic Peptide 1,206.1; Hemoglobin 9.4; Platelets 147 10/24/2023: BUN 35; Creatinine, Ser 1.77; Potassium 5.0; Sodium 142  Recent Lipid Panel No results found for: "CHOL", "TRIG", "HDL", "CHOLHDL", "VLDL", "LDLCALC", "LDLDIRECT"   Risk Assessment/Calculations:             Physical Exam:    VS:  BP (!) 120/46 (BP Location: Left Arm, Patient Position: Sitting, Cuff Size: Normal)   Pulse 91   Ht 5\' 8"  (1.727 m)   Wt 185 lb (83.9 kg)   SpO2 99%   BMI 28.13 kg/m     Wt Readings from Last 3 Encounters:  10/30/23 185 lb (83.9 kg)  09/26/23 165 lb (74.8 kg)  08/30/23 180 lb (81.6 kg)     GEN:  Well nourished, well developed in no acute distress HEENT: Normal NECK: No JVD; No carotid bruits CARDIAC: Irregular irregular RESPIRATORY: Diminished breath sounds bilaterally ABDOMEN: Soft, non-tender, distended MUSCULOSKELETAL:  1-2+ edema; No deformity  SKIN: Warm and dry NEUROLOGIC:  Alert and oriented x 3 PSYCHIATRIC:  Normal affect   ASSESSMENT:    1. HFrEF (heart failure with reduced ejection fraction) (HCC)   2. Permanent atrial fibrillation (HCC)   3. Primary hypertension   4. Coronary artery disease involving native coronary artery of native heart, unspecified whether angina present    PLAN:    In order of problems listed above:  Cardiomyopathy EF 40-45%.  Dyspnea on exertion, leg edema still present although improving.  Describes  NYHA class III heart failure.  Start Toprol-XL 25 mg daily, continue torsemide 40 mg daily.  Check BMP  today and in 10 days.  Aldactone previously stopped due to worsening renal function.  Low blood pressures preventing up titration of GDMT.  Refer to advanced heart failure clinic.Marland Kitchen Permanent A-fib, heart rate controlled.  Last INR therapeutic at 2.3.  Continue Coumadin, Toprol-XL.Marland Kitchen Hypertension, BP controlled.  Toprol-XL, torsemide 40 mg daily. History of CAD/PCI.  Continue warfarin, Lipitor 40 mg daily. Moderate to severe MR on echo.  Torsemide as above.  Likely not a surgical candidate due to age, comorbidities.     Follow-up in 2 to 3 months.     Medication Adjustments/Labs and Tests Ordered: Current medicines are reviewed at length with the patient today.  Concerns regarding medicines are outlined above.  Orders Placed This Encounter  Procedures   Basic metabolic panel   EKG 12-Lead   Meds ordered this encounter  Medications   metoprolol succinate (TOPROL-XL) 25 MG 24 hr tablet    Sig: Take 1 tablet (25 mg total) by mouth daily.    Dispense:  90 tablet    Refill:  1    Stopping lopressor    Patient Instructions  Medication Instructions:  - Your physician has recommended you make the following change in your medication:   1) STOP Lopressor (Metoprolol Tartrate)  2) START Toprol XL (Metoprolol Succinate) 25 mg: - take 1 tablet by mouth once daily    *If you need a refill on your cardiac medications before your next appointment, please call your pharmacy*   Lab Work:  1) Your physician recommends that you have lab work today: BMP   2) Your provider recommends that you return for lab work in: 10 days (around 11/10/23)  Labs ordered: BMP You DO NOT need to be fasting  Location: [facility] learning disability ([facility]) [address] ([address], [place] [address]  Lab Hours: Monday-Friday 8 am-4:30 pm  Lunch 1 pm- 2 pm     If you have labs (blood work) drawn today and your tests are completely normal, you will receive your results only  by: [employer] (if you have MyChart) OR A paper copy in the mail If you have any lab test that is abnormal or we need to change your treatment, we will call you to review the results.   Testing/Procedures: - none ordered   Follow-Up: At [facility], you and your health needs are our priority.  As part of our continuing mission to provide you with exceptional heart care, we have created designated Provider Care Teams.  These Care Teams include your primary Cardiologist (physician) and Advanced Practice Providers (APPs -  Physician Assistants and Nurse Practitioners) who all work together to provide you with the care you need, when you need it.  We recommend signing up for the patient portal called "MyChart".  Sign up information is provided on this After Visit Summary.  MyChart is used to connect with patients for Virtual Visits (Telemedicine).  Patients are able to view lab/test results, encounter notes, upcoming appointments, etc.  Non-urgent messages can be sent to your provider as well.   To learn more about what you can do with MyChart, go to [email or web address].    Your next appointment:    1) Tuesday 11/18/23 at 9:45 am with Dr. Elwyn Lade [facility]- Heart Failure Clinic Office phone: [phone] [address], [address]  2) 3 months with Dr. Azucena Cecil   Provider:   As above  Other Instructions N/a       Signed, Debbe Odea, MD  10/30/2023 12:51 PM    [facility]

## 2023-10-30 NOTE — Patient Instructions (Addendum)
 Medication Instructions:  - Your physician has recommended you make the following change in your medication:   1) STOP Lopressor (Metoprolol Tartrate)  2) START Toprol XL (Metoprolol Succinate) 25 mg: - take 1 tablet by mouth once daily    *If you need a refill on your cardiac medications before your next appointment, please call your pharmacy*   Lab Work:  1) Your physician recommends that you have lab work today: BMP   2) Your provider recommends that you return for lab work in: 10 days (around 11/10/23)  Labs ordered: BMP You DO NOT need to be fasting  Location: [facility] ([facility]) [address] ([address], [place] [address]  Lab Hours: Monday-Friday 8 am-4:30 pm  Lunch 1 pm- 2 pm     If you have labs (blood work) drawn today and your tests are completely normal, you will receive your results only by: [employer] (if you have MyChart) OR A paper copy in the mail If you have any lab test that is abnormal or we need to change your treatment, we will call you to review the results.   Testing/Procedures: - none ordered   Follow-Up: At [facility], you and your health needs are our priority.  As part of our continuing mission to provide you with exceptional heart care, we have created designated Provider Care Teams.  These Care Teams include your primary Cardiologist (physician) and Advanced Practice Providers (APPs -  Physician Assistants and Nurse Practitioners) who all work together to provide you with the care you need, when you need it.  We recommend signing up for the patient portal called "MyChart".  Sign up information is provided on this After Visit Summary.  MyChart is used to connect with patients for Virtual Visits (Telemedicine).  Patients are able to view lab/test results, encounter notes, upcoming appointments, etc.  Non-urgent messages can be sent to your provider as well.   To learn more about what  you can do with MyChart, go to [email or web address].    Your next appointment:    1) Tuesday 11/18/23 at 9:45 am with Dr. Elwyn Lade [facility]- Heart Failure [facility] Office phone: [phone] [address], [address]  2) 3 months with Dr. Azucena Cecil   Provider:   As above  Other Instructions N/a

## 2023-10-31 LAB — BASIC METABOLIC PANEL
BUN/Creatinine Ratio: 22 (ref 10–24)
BUN: 40 mg/dL — ABNORMAL HIGH (ref 10–36)
CO2: 20 mmol/L (ref 20–29)
Calcium: 8.2 mg/dL — ABNORMAL LOW (ref 8.6–10.2)
Chloride: 101 mmol/L (ref 96–106)
Creatinine, Ser: 1.79 mg/dL — ABNORMAL HIGH (ref 0.76–1.27)
Glucose: 102 mg/dL — ABNORMAL HIGH (ref 70–99)
Potassium: 4.5 mmol/L (ref 3.5–5.2)
Sodium: 141 mmol/L (ref 134–144)
eGFR: 35 mL/min/{1.73_m2} — ABNORMAL LOW (ref >59)

## 2023-11-03 ENCOUNTER — Encounter: Payer: Medicare Other | Admitting: Family

## 2023-11-03 ENCOUNTER — Other Ambulatory Visit: Payer: Self-pay

## 2023-11-04 ENCOUNTER — Ambulatory Visit
Admission: RE | Admit: 2023-11-04 | Discharge: 2023-11-04 | Disposition: A | Discharge status: Home / Self Care | Payer: Medicare Other | Source: Ambulatory Visit | Attending: Nephrology | Admitting: Nephrology

## 2023-11-04 DIAGNOSIS — N1832 Chronic kidney disease, stage 3b: Secondary | ICD-10-CM | POA: Diagnosis present

## 2023-11-12 ENCOUNTER — Other Ambulatory Visit: Payer: Self-pay

## 2023-11-12 DIAGNOSIS — N289 Disorder of kidney and ureter, unspecified: Secondary | ICD-10-CM

## 2023-11-13 LAB — BASIC METABOLIC PANEL
BUN/Creatinine Ratio: 21 (ref 10–24)
BUN: 30 mg/dL (ref 10–36)
CO2: 17 mmol/L — ABNORMAL LOW (ref 20–29)
Calcium: 8.3 mg/dL — ABNORMAL LOW (ref 8.6–10.2)
Chloride: 105 mmol/L (ref 96–106)
Creatinine, Ser: 1.43 mg/dL — ABNORMAL HIGH (ref 0.76–1.27)
Glucose: 103 mg/dL — ABNORMAL HIGH (ref 70–99)
Potassium: 4.7 mmol/L (ref 3.5–5.2)
Sodium: 138 mmol/L (ref 134–144)
eGFR: 46 mL/min/{1.73_m2} — ABNORMAL LOW (ref >59)

## 2023-11-17 ENCOUNTER — Telehealth: Payer: Self-pay | Admitting: Cardiology

## 2023-11-17 NOTE — Telephone Encounter (Signed)
 Pt confirmed appt on 11/18/23

## 2023-11-18 ENCOUNTER — Ambulatory Visit: Payer: Medicare Other | Attending: Cardiology | Admitting: Cardiology

## 2023-11-18 ENCOUNTER — Other Ambulatory Visit: Payer: Self-pay | Admitting: Cardiology

## 2023-11-18 DIAGNOSIS — I5020 Unspecified systolic (congestive) heart failure: Secondary | ICD-10-CM | POA: Diagnosis not present

## 2023-11-18 DIAGNOSIS — I4821 Permanent atrial fibrillation: Secondary | ICD-10-CM

## 2023-11-18 MED ORDER — POTASSIUM CHLORIDE CRYS ER 20 MEQ PO TBCR: 40.0000 meq | EXTENDED_RELEASE_TABLET | ORAL | 3 refills | Status: DC

## 2023-11-18 MED ORDER — TAMSULOSIN HCL 0.4 MG PO CAPS: 0.4000 mg | ORAL_CAPSULE | Freq: Every day | ORAL | 3 refills | Status: DC

## 2023-11-18 MED ORDER — METOLAZONE 5 MG PO TABS: 5.0000 mg | ORAL_TABLET | ORAL | 3 refills | Status: DC

## 2023-11-18 MED ORDER — TORSEMIDE 20 MG PO TABS: 40.0000 mg | ORAL_TABLET | Freq: Every day | ORAL | 3 refills | Status: DC

## 2023-11-18 NOTE — Progress Notes (Unsigned)
   ADVANCED HEART FAILURE NEW PATIENT CLINIC NOTE  Referring Physician: Debbe Odea, MD  Primary Care: Gracelyn Nurse, MD Primary Cardiologist:  HPI: Alan Barrett is a 89 y.o. male with a PMH of COPD, T2DM, HTN, CAD, A-fib on warfarin, HFpEF who presents for initial visit for further evaluation and treatment of heart failure/cardiomyopathy.      Patient recently seen in the emergency room 12/24 for shortness of breath and lower extremity swelling.  He was referred to general cardiology and found to have an echocardiogram with a mildly reduced ejection fraction of 40 to 45% as well as moderately dilated RV.  He had significant biatrial dilation as well as moderate to severe mitral regurgitation as well as tricuspid regurgitation.  He had been difficult to diurese so was referred to heart failure for further evaluation.     SUBJECTIVE: Patient reports having ongoing shortness of breath and swelling, though mildly improved since he started seeing cardiology.  He has not been urinating as much the past few days on the current dose of torsemide.  He is accompanied by his wife and daughter in the room.  He reports that oftentimes when he goes to the bathroom he just has a dribble come out.  PMH, current medications, allergies, social history, and family history reviewed in epic.  PHYSICAL EXAM: There were no vitals filed for this visit. GENERAL: Elderly, chronically ill-appearing PULM:  Normal work of breathing, clear to auscultation bilaterally. Respirations are unlabored.  CARDIAC:  JVP: moderately elevated with v waves         Irregular rate and rhythm, systolic murmur ABDOMEN: Soft, non-tender, non-distended. NEUROLOGIC: Patient is oriented x3 with no focal or lateralizing neurologic deficits.    ASSESSMENT & PLAN:  Chronic heart failure with midrange EF: Secondary to longstanding atrial fibrillation, hypertension, age.  Given CKD and lack of ischemic symptoms there is  minimal benefit to coronary angiography.  Will increase diuretic regimen and stop metoprolol.  Pressure low and limits titration of medical therapy. -Increase torsemide to 40 mg daily -Start metolazone 5 mg Tuesday/Friday - potassium with metolazone -Stop metoprolol -No SGLT2 given age -Consider spironolactone in the future, prior bump in creatinine  CKD: Stage IIIa, discussed aggressive diuresis may bump renal function, but may have to tolerate in order to improve volume status. - Changes as above  Urinary urgency: No pain with urination but some hesitancy. - Start flomax  DM: Stop metformin given nausea.   Permanent atrial fibrillation: Unlikely to ever return to sinus given history and atrial size.  - Continue warfarin  Follow up in 3 weeks with APP clinic  Clearnce Hasten, MD Advanced Heart Failure Mechanical Circulatory Support 11/21/23

## 2023-11-18 NOTE — Patient Instructions (Addendum)
 Medication Changes:  STOP Metoprolol and Metformin  INCREASE Torsemide 40mg  (2 tabs) daily  START Metolazone 5mg  (1 tab) every Tuesday and Friday  START Potassium (2 tabs) every Tuesday and Friday  START Tamsulosin 0.4mg  (1 tab) daily   Follow-Up in: Please follow up with the Advanced Heart Failure Clinic in 3 weeks with Clarisa Kindred, NP.  At the Advanced [facility] Failure Clinic, you and your health needs are our priority. We have a designated team specialized in the treatment of Heart Failure. This Care Team includes your primary Heart Failure Specialized Cardiologist (physician), Advanced Practice Providers (APPs- Physician Assistants and Nurse Practitioners), and Pharmacist who all work together to provide you with the care you need, when you need it.   You may see any of the following providers on your designated Care Team at your next follow up:  Dr. Arvilla Meres Dr. Marca Ancona Dr. Dorthula Nettles Dr. Theresia Bough Clarisa Kindred, FNP Enos Fling, RPH-CPP  Please be sure to bring in all your medications bottles to every appointment.   Need to Contact us:  If you have any questions or concerns before your next appointment please send [place] a message through [place] or call our office at [phone].    TO LEAVE A MESSAGE FOR THE NURSE SELECT OPTION 2, PLEASE LEAVE A MESSAGE INCLUDING: YOUR NAME DATE OF BIRTH CALL BACK NUMBER REASON FOR CALL**this is important as we prioritize the call backs  YOU WILL RECEIVE A CALL BACK THE SAME DAY AS LONG AS YOU CALL BEFORE 4:00 PM

## 2023-11-19 ENCOUNTER — Other Ambulatory Visit: Payer: Self-pay

## 2023-11-19 ENCOUNTER — Other Ambulatory Visit: Payer: Self-pay | Admitting: Cardiology

## 2023-11-19 MED ORDER — POTASSIUM CHLORIDE CRYS ER 20 MEQ PO TBCR
40.0000 meq | EXTENDED_RELEASE_TABLET | ORAL | 3 refills | Status: DC
Start: 2023-11-19 — End: 2023-12-29

## 2023-11-19 MED ORDER — TORSEMIDE 20 MG PO TABS: 40.0000 mg | ORAL_TABLET | Freq: Every day | ORAL | 3 refills | Status: DC

## 2023-11-20 ENCOUNTER — Other Ambulatory Visit: Payer: Self-pay | Admitting: Cardiology

## 2023-11-21 ENCOUNTER — Encounter: Payer: Self-pay | Admitting: *Deleted

## 2023-12-08 ENCOUNTER — Telehealth: Payer: Self-pay | Admitting: Family

## 2023-12-08 NOTE — Telephone Encounter (Incomplete)
 Called to confirm/remind patient of their appointment at the Advanced Heart Failure Clinic on 12/09/23***.   Appointment:   [] Confirmed  [] Left mess   [x] No answer/No voice mail  [] Phone not in service  Patient reminded to bring all medications and/or complete list.  Confirmed patient has transportation. Gave directions, instructed to utilize valet parking.

## 2023-12-08 NOTE — Progress Notes (Unsigned)
   ADVANCED HEART FAILURE NEW PATIENT CLINIC NOTE  Referring Physician: Gracelyn Nurse, MD  Primary Care: Gracelyn Nurse, MD Primary Cardiologist:  HPI: Alan Barrett is a 89 y.o. male with a PMH of COPD, T2DM, HTN, CAD, A-fib on warfarin, HFpEF who presents for initial visit for further evaluation and treatment of heart failure/cardiomyopathy.      Patient recently seen in the emergency room 12/24 for shortness of breath and lower extremity swelling.  He was referred to general cardiology and found to have an echocardiogram with a mildly reduced ejection fraction of 40 to 45% as well as moderately dilated RV.  He had significant biatrial dilation as well as moderate to severe mitral regurgitation as well as tricuspid regurgitation.  He had been difficult to diurese so was referred to heart failure for further evaluation.     SUBJECTIVE: Patient reports having ongoing shortness of breath and swelling, though mildly improved since he started seeing cardiology.  He has not been urinating as much the past few days on the current dose of torsemide.  He is accompanied by his wife and daughter in the room.  He reports that oftentimes when he goes to the bathroom he just has a dribble come out.  PMH, current medications, allergies, social history, and family history reviewed in epic.  PHYSICAL EXAM: There were no vitals filed for this visit. GENERAL: Elderly, chronically ill-appearing PULM:  Normal work of breathing, clear to auscultation bilaterally. Respirations are unlabored.  CARDIAC:  JVP: moderately elevated with v waves         Irregular rate and rhythm, systolic murmur ABDOMEN: Soft, non-tender, non-distended. NEUROLOGIC: Patient is oriented x3 with no focal or lateralizing neurologic deficits.    ASSESSMENT & PLAN:  Chronic heart failure with midrange EF: Secondary to longstanding atrial fibrillation, hypertension, age.  Given CKD and lack of ischemic symptoms there is  minimal benefit to coronary angiography.  Will increase diuretic regimen and stop metoprolol.  Pressure low and limits titration of medical therapy. -Increase torsemide to 40 mg daily -Start metolazone 5 mg Tuesday/Friday - potassium with metolazone -Stop metoprolol -No SGLT2 given age -Consider spironolactone in the future, prior bump in creatinine  CKD: Stage IIIa, discussed aggressive diuresis may bump renal function, but may have to tolerate in order to improve volume status. - Changes as above  Urinary urgency: No pain with urination but some hesitancy. - Start flomax  DM: Stop metformin given nausea.   Permanent atrial fibrillation: Unlikely to ever return to sinus given history and atrial size.  - Continue warfarin  Follow up in 3 weeks with APP clinic  Clearnce Hasten, MD Advanced Heart Failure Mechanical Circulatory Support 12/08/23

## 2023-12-09 ENCOUNTER — Encounter: Payer: Self-pay | Admitting: Family

## 2023-12-09 ENCOUNTER — Ambulatory Visit (HOSPITAL_BASED_OUTPATIENT_CLINIC_OR_DEPARTMENT_OTHER): Admitting: Family

## 2023-12-09 ENCOUNTER — Other Ambulatory Visit
Admission: RE | Admit: 2023-12-09 | Discharge: 2023-12-09 | Disposition: A | Discharge status: Home / Self Care | Source: Ambulatory Visit | Attending: Family | Admitting: Family

## 2023-12-09 VITALS — BP 126/74 | HR 63 | Wt 167.6 lb

## 2023-12-09 DIAGNOSIS — E1122 Type 2 diabetes mellitus with diabetic chronic kidney disease: Secondary | ICD-10-CM

## 2023-12-09 DIAGNOSIS — I5020 Unspecified systolic (congestive) heart failure: Secondary | ICD-10-CM

## 2023-12-09 DIAGNOSIS — R3915 Urgency of urination: Secondary | ICD-10-CM

## 2023-12-09 DIAGNOSIS — N1831 Chronic kidney disease, stage 3a: Secondary | ICD-10-CM

## 2023-12-09 DIAGNOSIS — I4821 Permanent atrial fibrillation: Secondary | ICD-10-CM

## 2023-12-09 LAB — BASIC METABOLIC PANEL WITH GFR
Anion gap: 11 (ref 5–15)
BUN: 43 mg/dL — ABNORMAL HIGH (ref 8–23)
CO2: 33 mmol/L — ABNORMAL HIGH (ref 22–32)
Calcium: 8.7 mg/dL — ABNORMAL LOW (ref 8.9–10.3)
Chloride: 94 mmol/L — ABNORMAL LOW (ref 98–111)
Creatinine, Ser: 1.47 mg/dL — ABNORMAL HIGH (ref 0.61–1.24)
GFR, Estimated: 44 mL/min — ABNORMAL LOW (ref >60)
Glucose, Bld: 202 mg/dL — ABNORMAL HIGH (ref 70–99)
Potassium: 3.1 mmol/L — ABNORMAL LOW (ref 3.5–5.1)
Sodium: 138 mmol/L (ref 135–145)

## 2023-12-09 MED ORDER — METOLAZONE 5 MG PO TABS
5.0000 mg | ORAL_TABLET | ORAL | 3 refills | Status: DC
Start: 2023-12-09 — End: 2024-01-13

## 2023-12-09 NOTE — Progress Notes (Signed)
 [facility] - HEART FAILURE CLINIC - PHARMACIST COUNSELING NOTE  Adherence Assessment  Do you ever forget to take your medication? [] Yes [x] No  Do you ever skip doses due to side effects? [] Yes [x] No  Do you have trouble affording your medicines? [] Yes [x] No  Are you ever unable to pick up your medication due to transportation difficulties? [] Yes [x] No  Do you ever stop taking your medications because you don't believe they are helping? [] Yes [x] No  Do you check your weight daily? [] Yes [x] No  Do you check your blood pressure daily?  [] Yes [x] No  Adherence strategy: Has organized system at home; declined pill box   Barriers to obtaining medications: None reported    Vital signs: HR 63, BP 126/74, weight 167 lb.  ECHO: Date 10/20/23, EF 40-45% Renal function: Date 12/09/23, GFR 44  Current Guideline-Directed Medical Therapy/Evidence Based Medicine  ACE/ARB/ARNI: N/A Target dose: N/A  Beta Blocker:  Metoprolol discontinued 11/18/23 Target dose: N/A  Aldosterone Antagonist:  N/A Target dose: N/A  SGLT2i:  N/A Target dose: N/A  Diuretic: Torsemide 40 mg daily plus metolazone 5 mg Tues & Friday (KCl 40 mEq with each dose of metolazone)   ASSESSMENT 89 year old male who presents to the HF clinic for a 3 week appointment with his wife and daughter. PMH is significant for COPD, T2DM, HTN, CAD, A-fib on warfarin and HFpEF. He reports that he has less swelling and SOB since diuretic was increased. Daughter states that his weight is down significantly from last visit.   Recent ED visit or hospitalization (past 6 months):  Date - 08/30/23, [facility] - SOB , Admission Dx - congestive heart failure   PLAN Continue current regimen as directed by NP   Plan to adjust metolazone to once weekly instead of twice weekly Consider spironolactone eventually if/when able to tolerate   Time spent: 20 minutes   Littie Deeds, PharmD Pharmacy Resident  12/09/2023 8:16 AM

## 2023-12-09 NOTE — Patient Instructions (Addendum)
 Medication Changes:  DECREASE YOUR METOLAZONE TO ONCE WEEKLY  CONTINUE POTASSIUM WITH METOLAZONE   Lab Work:  Go over to the [facility]. Go pass the gift shop and have your blood work completed.  We will only call you if the results are abnormal or if the provider would like to make medication changes.   Follow-Up in: 1 MONTH WITH DR. Elwyn Lade.  At the Advanced Heart Failure Clinic, you and your health needs are our priority. We have a designated team specialized in the treatment of Heart Failure. This Care Team includes your primary Heart Failure Specialized Cardiologist (physician), Advanced Practice Providers (APPs- Physician Assistants and Nurse Practitioners), and Pharmacist who all work together to provide you with the care you need, when you need it.   You may see any of the following providers on your designated Care Team at your next follow up:  Dr. Arvilla Meres Dr. Marca Ancona Dr. Dorthula Nettles Dr. Theresia Bough Clarisa Kindred, FNP Enos Fling, RPH-CPP  Please be sure to bring in all your medications bottles to every appointment.   Need to Contact us:  If you have any questions or concerns before your next appointment please send [place] a message through [place] or call our office at [phone].    TO LEAVE A MESSAGE FOR THE NURSE SELECT OPTION 2, PLEASE LEAVE A MESSAGE INCLUDING: YOUR NAME DATE OF BIRTH CALL BACK NUMBER REASON FOR CALL**this is important as we prioritize the call backs  YOU WILL RECEIVE A CALL BACK THE SAME DAY AS LONG AS YOU CALL BEFORE 4:00 PM

## 2023-12-29 ENCOUNTER — Telehealth: Payer: Self-pay

## 2023-12-29 NOTE — Telephone Encounter (Signed)
 Pt daughter called to make us  aware that the pt's other MD made changes to the pt's potassium after doing lab work. Changed in our system. Verified upcoming appointment. No further questions at this time.

## 2024-01-12 ENCOUNTER — Telehealth: Payer: Self-pay | Admitting: Family

## 2024-01-12 NOTE — Telephone Encounter (Signed)
 Called to confirm/remind patient of their appointment at the Advanced Heart Failure Clinic on 01/13/24.   Appointment:   [] Confirmed  [x] Left mess   [] No answer/No voice mail  [] VM Full/unable to leave message  [] Phone not in service  Patient reminded to bring all medications and/or complete list.  Confirmed patient has transportation. Gave directions, instructed to utilize valet parking.

## 2024-01-13 ENCOUNTER — Encounter: Payer: Self-pay | Admitting: Cardiology

## 2024-01-13 ENCOUNTER — Ambulatory Visit: Attending: Cardiology | Admitting: Cardiology

## 2024-01-13 VITALS — BP 130/58 | HR 90 | Wt 167.8 lb

## 2024-01-13 DIAGNOSIS — I130 Hypertensive heart and chronic kidney disease with heart failure and stage 1 through stage 4 chronic kidney disease, or unspecified chronic kidney disease: Secondary | ICD-10-CM | POA: Diagnosis not present

## 2024-01-13 DIAGNOSIS — I5032 Chronic diastolic (congestive) heart failure: Secondary | ICD-10-CM | POA: Diagnosis present

## 2024-01-13 DIAGNOSIS — I4821 Permanent atrial fibrillation: Secondary | ICD-10-CM | POA: Insufficient documentation

## 2024-01-13 DIAGNOSIS — E1122 Type 2 diabetes mellitus with diabetic chronic kidney disease: Secondary | ICD-10-CM | POA: Diagnosis not present

## 2024-01-13 DIAGNOSIS — Z7901 Long term (current) use of anticoagulants: Secondary | ICD-10-CM | POA: Insufficient documentation

## 2024-01-13 DIAGNOSIS — R3915 Urgency of urination: Secondary | ICD-10-CM | POA: Diagnosis not present

## 2024-01-13 DIAGNOSIS — I5020 Unspecified systolic (congestive) heart failure: Secondary | ICD-10-CM | POA: Diagnosis not present

## 2024-01-13 DIAGNOSIS — I2510 Atherosclerotic heart disease of native coronary artery without angina pectoris: Secondary | ICD-10-CM | POA: Insufficient documentation

## 2024-01-13 DIAGNOSIS — N1831 Chronic kidney disease, stage 3a: Secondary | ICD-10-CM | POA: Insufficient documentation

## 2024-01-13 DIAGNOSIS — Z79899 Other long term (current) drug therapy: Secondary | ICD-10-CM | POA: Insufficient documentation

## 2024-01-13 DIAGNOSIS — J449 Chronic obstructive pulmonary disease, unspecified: Secondary | ICD-10-CM | POA: Insufficient documentation

## 2024-01-13 MED ORDER — POTASSIUM CHLORIDE ER 10 MEQ PO TBCR: 10.0000 meq | EXTENDED_RELEASE_TABLET | Freq: Every day | ORAL | 3 refills | Status: DC

## 2024-01-13 MED ORDER — METOLAZONE 5 MG PO TABS: 5.0000 mg | ORAL_TABLET | ORAL | 3 refills | Status: DC | PRN

## 2024-01-13 NOTE — Patient Instructions (Signed)
 Medication Changes:  TAKE YOUR METOLAZONE  AS NEEDED ONLY FOR WEIGHT GAIN  START POTASSIUM 10 MEQ DAILY   Lab Work:  Go DOWN to LOWER LEVEL (LL) to have your blood work completed inside of [employer] office.  We will only call you if the results are abnormal or if the provider would like to make medication changes.   Follow-Up in: 3 MONTHS WITH DR. Alease Amend.  Our Doctors' schedules are NOT open yet for 3 months. We will place you on our recall list. Once they are available, we will call you to schedule your follow up appointment.   At the [facility], you and your health needs are our priority. We have a designated team specialized in the treatment of Heart Failure. This Care Team includes your primary Heart Failure Specialized Cardiologist (physician), Advanced Practice Providers (APPs- Physician Assistants and Nurse Practitioners), and Pharmacist who all work together to provide you with the care you need, when you need it.   You may see any of the following providers on your designated Care Team at your next follow up:  Dr. Jules Oar Dr. Peder Bourdon Dr. Alwin Baars Dr. Judyth Nunnery Shawnee Dellen, FNP Bevely Brush, RPH-CPP  Please be sure to bring in all your medications bottles to every appointment.   Need to Contact Us :  If you have any questions or concerns before your next appointment please send us  a message through [place] or call our office at [phone].    TO LEAVE A MESSAGE FOR THE NURSE SELECT OPTION 2, PLEASE LEAVE A MESSAGE INCLUDING: YOUR NAME DATE OF BIRTH CALL BACK NUMBER REASON FOR CALL**this is important as we prioritize the call backs  YOU WILL RECEIVE A CALL BACK THE SAME DAY AS LONG AS YOU CALL BEFORE 4:00 PM

## 2024-01-13 NOTE — Progress Notes (Unsigned)
   ADVANCED HEART FAILURE FOLLOW UP CLINIC NOTE  Referring Physician: Little Riff, MD  Primary Care: Little Riff, MD Primary Cardiologist:  HPI: Alan Barrett is a 89 y.o. male with a PMH of with a PMH of COPD, T2DM, HTN, CAD, A-fib on warfarin, HFpEF who presents for follow up of chronic diastolic heart failure.         Patient recently seen in the emergency room 12/24 for shortness of breath and lower extremity swelling.  He was referred to general cardiology and found to have an echocardiogram with a mildly reduced ejection fraction of 40 to 45% as well as moderately dilated RV.  He had significant biatrial dilation as well as moderate to severe mitral regurgitation as well as tricuspid regurgitation.  He had been difficult to diurese so was referred to heart failure for further evaluation.      SUBJECTIVE: Since his last visit the patient has lost a significant amount of weight and is feeling much better. He is able to sleep in bed as opposed to a recliner and his legs are significantly improved. He denies any dizziness or lightheadedness, no issues with his medications. Still urinates a lot with his torsemide  dosage.    PMH, current medications, allergies, social history, and family history reviewed in epic.  PHYSICAL EXAM: Vitals:   01/13/24 1001  BP: (!) 130/58  Pulse: 90  SpO2: 98%   GENERAL: Chronically ill appearing, hard of hearing PULM:  Normal work of breathing, clear to auscultation bilaterally. Respirations are unlabored.  CARDIAC:  JVP: mildly elevated         Normal rate with regular rhythm. Soft systolic murmur.  1+ LE edema. Warm and well perfused extremities. ABDOMEN: Soft, non-tender, non-distended. NEUROLOGIC: Patient is oriented x3 with no focal or lateralizing neurologic deficits.    DATA REVIEW   ECHO: 10/20/23: LVEF 40-45%, Grade I DD, mildly elevated RVSP, biatrial severe dilation, moderate to severe MR with prolapse, moderate TR    CATH: None    ASSESSMENT & PLAN:  Chronic heart failure with midrange EF: Secondary to longstanding atrial fibrillation, hypertension, age.  Given CKD and lack of ischemic symptoms there is minimal benefit to coronary angiography.  Improved significantly with increase in diuretics. -Instructed to continue toresmide 40mg  daily, hold for orthostasis or dizzy symptoms -Stop scheduled metolazone , only use prn -40meq potassium with metolazone  -Poor SGLT2 candidate given age -Avoid MRA at this time given reduced GFR - Continue KCL daily   CKD: Stage IIIa, relatively stable with diuretics - Changes as above   Urinary urgency: No pain with urination but some hesitancy. - Continue flomax    DM: Stop metformin  given nausea.    Permanent atrial fibrillation: Unlikely to ever return to sinus given history and atrial size.  - Continue warfarin  Follow up in 3 months  Arta Lark, MD Advanced [facility] Mechanical Circulatory Support 01/13/24

## 2024-01-14 ENCOUNTER — Ambulatory Visit (HOSPITAL_COMMUNITY): Payer: Self-pay | Admitting: Cardiology

## 2024-01-14 LAB — BASIC METABOLIC PANEL WITH GFR
BUN/Creatinine Ratio: 23 (ref 10–24)
BUN: 38 mg/dL — ABNORMAL HIGH (ref 10–36)
CO2: 26 mmol/L (ref 20–29)
Calcium: 8.8 mg/dL (ref 8.6–10.2)
Chloride: 97 mmol/L (ref 96–106)
Creatinine, Ser: 1.67 mg/dL — ABNORMAL HIGH (ref 0.76–1.27)
Glucose: 242 mg/dL — ABNORMAL HIGH (ref 70–99)
Potassium: 3.4 mmol/L — ABNORMAL LOW (ref 3.5–5.2)
Sodium: 139 mmol/L (ref 134–144)
eGFR: 38 mL/min/{1.73_m2} — ABNORMAL LOW (ref >59)

## 2024-01-19 ENCOUNTER — Other Ambulatory Visit: Payer: Self-pay

## 2024-01-19 MED ORDER — METOLAZONE 5 MG PO TABS: 5.0000 mg | ORAL_TABLET | Freq: Every day | ORAL | 3 refills | Status: DC | PRN

## 2024-01-29 ENCOUNTER — Encounter: Payer: Self-pay | Admitting: Cardiology

## 2024-01-29 ENCOUNTER — Ambulatory Visit: Payer: Medicare Other | Attending: Cardiology | Admitting: Cardiology

## 2024-01-29 VITALS — BP 138/66 | HR 86 | Ht 68.0 in | Wt 175.5 lb

## 2024-01-29 DIAGNOSIS — I10 Essential (primary) hypertension: Secondary | ICD-10-CM | POA: Diagnosis not present

## 2024-01-29 DIAGNOSIS — I4821 Permanent atrial fibrillation: Secondary | ICD-10-CM | POA: Diagnosis not present

## 2024-01-29 DIAGNOSIS — I5020 Unspecified systolic (congestive) heart failure: Secondary | ICD-10-CM

## 2024-01-29 DIAGNOSIS — I2510 Atherosclerotic heart disease of native coronary artery without angina pectoris: Secondary | ICD-10-CM | POA: Diagnosis not present

## 2024-01-29 MED ORDER — POTASSIUM CHLORIDE ER 10 MEQ PO TBCR: 10.0000 meq | EXTENDED_RELEASE_TABLET | Freq: Every day | ORAL | 3 refills | Status: DC

## 2024-01-29 MED ORDER — METOLAZONE 5 MG PO TABS: 5.0000 mg | ORAL_TABLET | Freq: Every day | ORAL | 3 refills | Status: AC | PRN

## 2024-01-29 NOTE — Progress Notes (Signed)
 Cardiology Office Note:    Date:  01/29/2024   ID:  Alan Barrett, DOB [birth year], MRN [id number]  PCP:  Little Riff, MD   [facility] Providers Cardiologist:  Constancia Delton, MD     Referring MD: Little Riff, MD   Chief Complaint  Patient presents with   3 month follow up     "Doing well."     History of Present Illness:    Alan Barrett is a 89 y.o. male with a hx of cardiomyopathy EF 40 to 45%, moderate to severe MR CAD/PCI (remote ?2010), permanent atrial fibrillation on Coumadin , hypertension, hyperlipidemia, COPD who presents for follow-up.  Previously seen due to cardiomyopathy and edema, torsemide  was started and increased to 40 mg daily.  Aldactone  was stopped due to worsening renal function.  Toprol -XL stopped due to low BP.  Edema is well controlled, denies chest pain, shortness of breath.  Overall doing well with no concerns today.  Prior notes/testing Repeat echocardiogram 10/2023 showed EF 40 to 45%, moderate to severe MR. Aldactone  stopped due to worsening renal function.  Toprol -XL stopped due to low BP Echocardiogram 2017 EF 50 to 55%  Past Medical History:  Diagnosis Date   Atrial fibrillation (HCC)    Cancer (HCC)    CHF (congestive heart failure) (HCC)    COPD (chronic obstructive pulmonary disease) (HCC)    Coronary artery disease    Diabetes mellitus without complication (HCC)    GERD (gastroesophageal reflux disease)    Hyperlipemia    Hypertension     Past Surgical History:  Procedure Laterality Date   PROSTATE SURGERY     STENT PLACEMENT VASCULAR ([facility] HX)     TONSILLECTOMY      Current Medications: Current Meds  Medication Sig   atorvastatin  (LIPITOR) 40 MG tablet Take 40 mg by mouth daily.   chlorpheniramine-HYDROcodone (TUSSIONEX) 10-8 MG/5ML SUER Take 5 mLs by mouth every 12 (twelve) hours as needed for cough.   guaiFENesin  (MUCINEX ) 600 MG 12 hr tablet Take 1 tablet (600 mg total) by mouth 2 (two) times  daily.   ipratropium-albuterol  (DUONEB) 0.5-2.5 (3) MG/3ML SOLN Take 3 mLs by nebulization 4 (four) times daily.   latanoprost  (XALATAN ) 0.005 % ophthalmic solution Place 1 drop into both eyes at bedtime.   magnesium  oxide (MAG-OX) 400 (240 Mg) MG tablet Take 400 mg by mouth daily.   omeprazole (PRILOSEC) 20 MG capsule Take 20 mg by mouth daily.   PROAIR  HFA 108 (90 Base) MCG/ACT inhaler Take 2 puffs by mouth every 4 (four) hours as needed.   tamsulosin  (FLOMAX ) 0.4 MG CAPS capsule Take 1 capsule (0.4 mg total) by mouth daily.   torsemide  (DEMADEX ) 20 MG tablet Take 2 tablets (40 mg total) by mouth daily.   traZODone  (DESYREL ) 50 MG tablet Take 50 mg by mouth at bedtime.   warfarin (COUMADIN ) 5 MG tablet Take 5 mg by mouth every evening. Pt is currently on coumadin  6 mg Th and 4 mg all other days.   [DISCONTINUED] metolazone  (ZAROXOLYN ) 5 MG tablet Take 1 tablet (5 mg total) by mouth daily as needed (FOR WEIGHT GAIN).   [DISCONTINUED] potassium chloride  (KLOR-CON ) 10 MEQ tablet Take 1 tablet (10 mEq total) by mouth daily.     Allergies:   Niacin and Niacin er (antihyperlipidemic)   Social History   Socioeconomic History   Marital status: Married    Spouse name: Not on file   Number of children: Not on  file   Years of education: Not on file   Highest education level: Not on file  Occupational History   Not on file  Tobacco Use   Smoking status: Former   Smokeless tobacco: Never  Vaping Use   Vaping status: Never Used  Substance and Sexual Activity   Alcohol use: Never   Drug use: Never   Sexual activity: Not on file  Other Topics Concern   Not on file  Social History Narrative   Not on file   Social Drivers of Health   Financial Resource Strain: Low Risk  (12/09/2023)   Received [facility]   Overall Financial Resource Strain (CARDIA)    Difficulty of Paying Living Expenses: Not very hard  Food Insecurity: No Food Insecurity (12/09/2023)   Received  from [facility]   Hunger Vital Sign    Worried About Running Out of Food in the Last Year: Never true    Ran Out of Food in the Last Year: Never true  Transportation Needs: No Transportation Needs (12/09/2023)   Received from [facility] - Transportation    In the past 12 months, has lack of transportation kept you from medical appointments or from getting medications?: No    Lack of Transportation (Non-Medical): No  Physical Activity: Not on file  Stress: Not on file  Social Connections: Not on file     Family History: The patient's family history includes Heart disease in his brother and sister.  ROS:   Please see the history of present illness.     All other systems reviewed and are negative.  EKGs/Labs/Other Studies Reviewed:    The following studies were reviewed today:       Recent Labs: 08/30/2023: B Natriuretic Peptide 1,206.1; Hemoglobin 9.4; Platelets 147 01/13/2024: BUN 38; Creatinine, Ser 1.67; Potassium 3.4; Sodium 139  Recent Lipid Panel No results found for: "CHOL", "TRIG", "HDL", "CHOLHDL", "VLDL", "LDLCALC", "LDLDIRECT"   Risk Assessment/Calculations:             Physical Exam:    VS:  BP 138/66 (BP Location: Left Arm, Patient Position: Sitting, Cuff Size: Normal)   Pulse 86   Ht 5\' 8"  (1.727 m)   Wt 175 lb 8 oz (79.6 kg)   SpO2 93%   BMI 26.68 kg/m     Wt Readings from Last 3 Encounters:  01/29/24 175 lb 8 oz (79.6 kg)  01/13/24 167 lb 12.8 oz (76.1 kg)  12/09/23 167 lb 9.6 oz (76 kg)     GEN:  Well nourished, well developed in no acute distress HEENT: Normal NECK: No JVD; No carotid bruits CARDIAC: Irregular irregular RESPIRATORY: Diminished breath sounds bilaterally ABDOMEN: Soft, non-tender, distended MUSCULOSKELETAL: no edema; No deformity  SKIN: Warm and dry NEUROLOGIC:  Alert and oriented x 3 PSYCHIATRIC:  Normal affect   ASSESSMENT:    1. HFrEF (heart failure with reduced ejection  fraction) (HCC)   2. Permanent atrial fibrillation (HCC)   3. Primary hypertension   4. Coronary artery disease involving native coronary artery of native heart, unspecified whether angina present     PLAN:    In order of problems listed above:  Cardiomyopathy EF 40-45%.  Dyspnea on exertion, leg edema still present although improving.  Describes  NYHA class III heart failure.  continue torsemide  40 mg daily.  Metolazone  as needed for edema/weight gain.  Appreciate input from heart failure service.  Monitor clinically for SGLT2, minimal benefit  of ischemic workup and lack of ischemic symptoms. Permanent A-fib, heart rate controlled.  Last INR therapeutic at 2.2.  Continue Coumadin . Hypertension, BP controlled. torsemide  40 mg daily. History of CAD/PCI.  Continue warfarin, Lipitor 40 mg daily. Moderate to severe MR on echo.  Torsemide  as above.  Currently asymptomatic.  Likely not a surgical candidate due to age, comorbidities.     Follow-up in 6 months.     Medication Adjustments/Labs and Tests Ordered: Current medicines are reviewed at length with the patient today.  Concerns regarding medicines are outlined above.  No orders of the defined types were placed in this encounter.  Meds ordered this encounter  Medications   potassium chloride  (KLOR-CON ) 10 MEQ tablet    Sig: Take 1 tablet (10 mEq total) by mouth daily.    Dispense:  90 tablet    Refill:  3   metolazone  (ZAROXOLYN ) 5 MG tablet    Sig: Take 1 tablet (5 mg total) by mouth daily as needed (FOR WEIGHT GAIN).    Dispense:  4 tablet    Refill:  3    Patient Instructions  Medication Instructions:  Your Physician recommend you continue on your current medication as directed.    *If you need a refill on your cardiac medications before your next appointment, please call your pharmacy*  Lab Work: No labs ordered today  If you have labs (blood work) drawn today and your tests are completely normal, you will receive your  results only by: MyChart Message (if you have MyChart) OR A paper copy in the mail If you have any lab test that is abnormal or we need to change your treatment, we will call you to review the results.  Testing/Procedures: No test ordered today   Follow-Up: At [facility], you and your health needs are our priority.  As part of our continuing mission to provide you with exceptional heart care, our providers are all part of one team.  This team includes your primary Cardiologist (physician) and Advanced Practice Providers or APPs (Physician Assistants and Nurse Practitioners) who all work together to provide you with the care you need, when you need it.  Your next appointment:   6 month(s)  Provider:   You may see Constancia Delton, MD or one of the following Advanced Practice Providers on your designated Care Team:   Laneta Pintos, NP Gildardo Labrador, PA-C Varney Gentleman, PA-C Cadence Fraser, PA-C Ronald Cockayne, NP Morey Ar, NP    We recommend signing up for the patient portal called "MyChart".  Sign up information is provided on this After Visit Summary.  MyChart is used to connect with patients for Virtual Visits (Telemedicine).  Patients are able to view lab/test results, encounter notes, upcoming appointments, etc.  Non-urgent messages can be sent to your provider as well.   To learn more about what you can do with MyChart, go to [email or web address].          Signed, Constancia Delton, MD  01/29/2024 12:29 PM    [facility]

## 2024-01-29 NOTE — Patient Instructions (Signed)
 Medication Instructions:  Your Physician recommend you continue on your current medication as directed.    *If you need a refill on your cardiac medications before your next appointment, please call your pharmacy*  Lab Work: No labs ordered today  If you have labs (blood work) drawn today and your tests are completely normal, you will receive your results only by: MyChart Message (if you have MyChart) OR A paper copy in the mail If you have any lab test that is abnormal or we need to change your treatment, we will call you to review the results.  Testing/Procedures: No test ordered today   Follow-Up: At [facility], you and your health needs are our priority.  As part of our continuing mission to provide you with exceptional heart care, our providers are all part of one team.  This team includes your primary Cardiologist (physician) and Advanced Practice Providers or APPs (Physician Assistants and Nurse Practitioners) who all work together to provide you with the care you need, when you need it.  Your next appointment:   6 month(s)  Provider:   You may see Constancia Delton, MD or one of the following Advanced Practice Providers on your designated Care Team:   Laneta Pintos, NP Gildardo Labrador, PA-C Varney Gentleman, PA-C Cadence [place], PA-C Ronald Cockayne, NP Morey Ar, NP    We recommend signing up for the patient portal called "MyChart".  Sign up information is provided on this After Visit Summary.  MyChart is used to connect with patients for Virtual Visits (Telemedicine).  Patients are able to view lab/test results, encounter notes, upcoming appointments, etc.  Non-urgent messages can be sent to your provider as well.   To learn more about what you can do with MyChart, go to [email or web address].

## 2024-02-16 ENCOUNTER — Telehealth: Payer: Self-pay | Admitting: Family

## 2024-02-16 MED ORDER — TAMSULOSIN HCL 0.4 MG PO CAPS: 0.4000 mg | ORAL_CAPSULE | Freq: Every day | ORAL | 6 refills | Status: DC

## 2024-02-16 NOTE — Telephone Encounter (Signed)
 Med started by Dr Alease Amend in March, refill sent

## 2024-04-05 NOTE — Progress Notes (Signed)
 [facility] Follow Up Visit   Patient Name: Alan Barrett., male   Patient DOB: May 07, 1929 Date of Service: 04/05/2024  Patient MRN: [id number] Provider Creating Note: Woodward Brought, MD  [phone] Primary Care Physician: Rudolpho Norleen BIRCH, MD   [address] [place] [address] Additional Physicians/ Providers:   Impression/Recommendations   [name]. is a 89 y.o. male with diabetes mellitus type II, hypertension, atrial fibrillation, COPD, congestive heart failure, coronary artery disease, GERD, glaucoma, history of prostate cancer, and peptic ulcer disease who presents as a follow up  patient for evaluation of chronic kidney disease stage IIIB. Creatinine 1.51, GFR of 43.    Chronic kidney disease stage IIIB with proteinuria.  - avoid nonsteroidal anti-inflammatory agents.  - Continue losartan 25mg  daily - not currently on an SGLT-2 inhibitor.  - not currently on a mineralocorticoid receptor antagonist. Previously on spironolactone .  - avoid nonsteroidal anti-inflammatory agents.    Hypertension with chronic kidney disease: 159/78. Trace edema on lower extremities on examination. With recent exacerbation of congestive heart failure - current regimen of losartan, tamsulosin , torsemide  and metolazone  - no longer on metoprolol  - daily weights. Metolazone  PRN - home blood pressure monitoring.    Diabetes mellitus type II with chronic kidney disease: noninsulin dependent. Hemoglobin A1c of 7.1% on 12/04/23.  - Currently off all hypoglycemic agents.  - not currently on a SGLT-2 inhibitor.    Hyperlipidemia - continue atorvastatin   Hypokalemia and Hypomagnesemia:   - continue oral magnesium  tablet - stop potassium chloride  as starting losartan 25mg  daily as above.   Anemia of chronic kidney disease: hemoglobin 9.4 - refer to hematology.   Secondary Hyperparathyroidism: PTH of 96.  - start calcium  plus vitamin D   Patient Active Problem List   Diagnosis  . Congestive heart failure (HCC)  . Chronic kidney disease stage 3B (HCC)  . Hyperlipidemia  . Type 2 diabetes mellitus with diabetic chronic kidney disease (HCC)  . Hypertensive chronic kidney disease, benign, with chronic kidney disease stage I through stage IV, or unspecified  . Proteinuria  . Secondary hyperparathyroidism of renal origin (HCC)  . Anemia in chronic kidney disease    Orders Placed This Encounter  . PTH, Intact  . Renal Function Panel  . CBC and Differential  . Urinalysis, Complete w/reflex to Culture  . Protein, Total, Random Urine w/Creatinine (Protein/Creat Ratio)  . Ambulatory referral to Hematology / Oncology       Return in about 3 months (around 07/06/2024).  Chief Complaint   Chief Complaint  Patient presents with  . Follow-up    History of Present Illness   Mr. Alan Barrett. presents for follow up. Patient presents with history daughter and wife who assist with history taking.   Patient states that he is no longer taking metolazone . Just as needed. He has not needed since last visit.   His warfarin dose has been adjusted since last visit.   Patient claims to be taking all his medications as prescribed. On last visit, patient was started on a losartan 25mg  daily to help with blood pressure and lower extremity swelling. He swelling has resolved. He states he is tolerating the medication well. Wife states his blood pressure is somewhat better controlled.   Denies use of nonsteroidal anti-inflammatory agents.   No recent hospitalizations since last visit.   Medications   Current Outpatient Medications:  .  acetaminophen  (TYLENOL ) 500 MG tablet, Take by mouth every 6 (six) hours  if needed for mild pain, Disp: , Rfl:  .  albuterol  HFA (ProAir  HFA) 108 (90 Base) MCG/ACT inhaler, Take 2 puffs by mouth every 4 (four) hours as needed., Disp: , Rfl:  .  atorvastatin  (LIPITOR) 40 MG tablet, Take 1 tablet by mouth 1 (one) time each day  in the evening, Disp: , Rfl:  .  ipratropium-albuterol  (DUO-NEB) 0.5-2.5 mg/3 mL nebulizer solution, Take 3 mLs by nebulization 4 (four) times daily., Disp: , Rfl:  .  latanoprost  (XALATAN ) 0.005 % ophthalmic solution, Place 1 drop into both eyes at bedtime., Disp: , Rfl:  .  losartan (Cozaar) 25 MG tablet, Take 1 tablet (25 mg total) by mouth 1 (one) time each day, Disp: 30 tablet, Rfl: 11 .  magnesium  oxide 400 (240 Mg) MG tablet, Take 400 mg by mouth 1 (one) time each day, Disp: , Rfl:  .  metOLazone  5 MG tablet, Take 1 tablet (5 mg total) by mouth 2 (two) times a week. Take 1 tab every Tues and Fri., Disp: , Rfl:  .  omeprazole (PriLOSEC) 20 MG DR capsule, Take 20 mg by mouth 1 (one) time each day, Disp: , Rfl:  .  tamsulosin  (FLOMAX ) 0.4 MG 24 hr capsule, Take 0.4 mg by mouth, Disp: , Rfl:  .  torsemide  (DEMADEX ) 20 MG tablet, 40mg  daily, Disp: , Rfl:  .  traZODone  (DESYREL ) 50 MG tablet, Take 1 tablet by mouth 1 (one) time each day in the evening, Disp: , Rfl:  .  warfarin (COUMADIN ) 5 MG tablet, Take 5 mg by mouth 1 (one) time each day in the evening, Disp: , Rfl:    Allergies Niacin er (antihyperlipidemic)  History Past Medical History:  Diagnosis Date  . Anemia in chronic kidney disease 11/26/2023  . Atrial fibrillation (HCC)   . Chronic kidney disease stage 3B (HCC)   . Chronic obstructive pulmonary disease (HCC)   . Congestive heart failure (HCC)   . Coronary atherosclerosis of unspecified type of vessel, native or graft   . Gastroesophageal reflux disease   . Glaucoma   . Hyperlipidemia   . Hypertensive chronic kidney disease, benign, with chronic kidney disease stage I through stage IV, or unspecified   . Mitral valve prolapse   . Peptic ulcer   . Primary carcinoma of prostate (HCC)   . Proteinuria 10/29/2023  . Secondary hyperparathyroidism of renal origin (HCC) 11/26/2023  . Type 2 diabetes mellitus with diabetic chronic kidney disease [facility])     Past Surgical  History:  Procedure Laterality Date  . PROSTATE SURGERY    . TONSILLECTOMY    . TUBE THORACOTOMY     Family History  Problem Relation Age of Onset  . Cancer Mother   . Cancer Father    Social History   Tobacco Use  . Smoking status: Never  . Smokeless tobacco: Never  Substance Use Topics  . Alcohol use: Never     Physical Exam  Vitals BP 159/78 (BP Location: Right upper arm, Patient Position: Sitting)   Pulse 66   Temp 98.3 F   Wt 174 lb (78.9 kg)   SpO2 96%   BMI 28.96 kg/m   Vitals reviewed. Constitutional: He is oriented to person, place, and time. He appears well-developed.  HEENT:  Head: Normocephalic and atraumatic. Mouth/Throat: Oropharynx is clear and moist.  Eyes: Pupils are equal, round, and reactive to light.  Neck: Neck supple.  Cardiovascular:  Normal rate and regular rhythm.  Pulmonary/Chest: Effort normal and breath sounds normal.  Abdominal: Soft.  Neurological: He is alert and oriented to person, place, and time.  Skin: Skin is warm and dry.     Laboratory Studies  Chemistry  Lab Units 04/01/24 1153 03/03/24 1429 12/04/23 1002 10/29/23 1518 10/29/23 1456 06/04/23 1013 12/03/22 0944 05/30/22 1033  SODIUM mmol/L 141 138 141 142  --  139 140 141  POTASSIUM mmol/L 4.0 4.4 2.9* 4.0  --  3.4* 3.7 4.1  CHLORIDE mmol/L 101 99 95* 101  --  100 103 102  CO2 mmol/L 30 31 37.8* 19*  --  30.9 26.5 35*  ANION GAP   --   --   --   --   --  11.5  --  8.1  MAGNESIUM  mg/dL  --   --   --  1.0*  --   --   --   --   CALCIUM  mg/dL 8.6 9.3 8.7 8.1*  --  9 8.6* 8.8  PHOSPHORUS mg/dL 3.5 3.8  --  3.7  --   --   --   --   ALK PHOS U/L  --   --  95  --   --   --  63  --   PTH pg/mL 96* 56  --  59  --   --   --   --   GLUCOSE mg/dL 667* 744* 818* 897*  --  125* 140* 130*  ALBUMIN g/dL 3.6 3.8 3.7 3.7*  4.0 30 mg/L  --  4  --   BUN mg/dL 21 25 42* 39*  --  23 24 16   CREATININE mg/dL 8.48* 8.52* 1.4* 8.31*  --  1.1 1.3 1.1  HEMOGLOBIN A1C %  --   --   7.1*  --   --   --   --   --         No lab exists for component: IRON SATURATION, TRANSSATPER  CBC  Lab Units 04/01/24 1153 03/03/24 1429 10/29/23 1518  WBC AUTO Thousand/uL 5.6 4.5 4.3  HEMOGLOBIN g/dL 9.4* 9.5* 9.7*  HEMOGLOBIN URINE  NEGATIVE NEGATIVE  --   HEMATOCRIT % 32.4* 32.1* 31.8*  MCV fL 82.2 81.3 81.7  PLATELETS AUTO Thousand/uL 156 110* 219    Urine  Lab Units 04/01/24 1153 03/03/24 1429 10/29/23 1518 10/29/23 1456  COLOR U  YELLOW YELLOW  --   --   COLOR UA   --   --   --  Yellow  CLARITY UA   --   --   --  Clear  KETONES U MG/DL  NEGATIVE NEGATIVE  --   --   KETONES UA   --   --   --  Negative  PH UA   --   --   --  5.5  UROBILINOGEN UA   --   --   --  0.2  PROT/CREAT RATIO UR mg/g creat 0.455*  455* 0.400*  400* 0.300*  300*  --         No lab exists for component: CYCLOSPORITR     Woodward Brought, MD  [employer], [place]

## 2024-05-04 ENCOUNTER — Encounter: Payer: Self-pay | Admitting: Oncology

## 2024-05-04 ENCOUNTER — Inpatient Hospital Stay: Attending: Oncology | Admitting: Oncology

## 2024-05-04 ENCOUNTER — Inpatient Hospital Stay

## 2024-05-04 ENCOUNTER — Other Ambulatory Visit: Payer: Self-pay

## 2024-05-04 VITALS — BP 150/72 | HR 80 | Temp 97.9°F | Resp 18 | Ht 68.0 in | Wt 179.0 lb

## 2024-05-04 DIAGNOSIS — E119 Type 2 diabetes mellitus without complications: Secondary | ICD-10-CM | POA: Insufficient documentation

## 2024-05-04 DIAGNOSIS — I4891 Unspecified atrial fibrillation: Secondary | ICD-10-CM | POA: Diagnosis not present

## 2024-05-04 DIAGNOSIS — Z87891 Personal history of nicotine dependence: Secondary | ICD-10-CM | POA: Diagnosis not present

## 2024-05-04 DIAGNOSIS — E785 Hyperlipidemia, unspecified: Secondary | ICD-10-CM | POA: Diagnosis not present

## 2024-05-04 DIAGNOSIS — I2510 Atherosclerotic heart disease of native coronary artery without angina pectoris: Secondary | ICD-10-CM | POA: Insufficient documentation

## 2024-05-04 DIAGNOSIS — Z7901 Long term (current) use of anticoagulants: Secondary | ICD-10-CM | POA: Diagnosis not present

## 2024-05-04 DIAGNOSIS — Z79899 Other long term (current) drug therapy: Secondary | ICD-10-CM | POA: Diagnosis not present

## 2024-05-04 DIAGNOSIS — I130 Hypertensive heart and chronic kidney disease with heart failure and stage 1 through stage 4 chronic kidney disease, or unspecified chronic kidney disease: Secondary | ICD-10-CM | POA: Diagnosis present

## 2024-05-04 DIAGNOSIS — K219 Gastro-esophageal reflux disease without esophagitis: Secondary | ICD-10-CM | POA: Insufficient documentation

## 2024-05-04 DIAGNOSIS — R5383 Other fatigue: Secondary | ICD-10-CM | POA: Insufficient documentation

## 2024-05-04 DIAGNOSIS — N189 Chronic kidney disease, unspecified: Secondary | ICD-10-CM

## 2024-05-04 DIAGNOSIS — N1831 Chronic kidney disease, stage 3a: Secondary | ICD-10-CM | POA: Diagnosis not present

## 2024-05-04 DIAGNOSIS — E611 Iron deficiency: Secondary | ICD-10-CM | POA: Insufficient documentation

## 2024-05-04 DIAGNOSIS — J449 Chronic obstructive pulmonary disease, unspecified: Secondary | ICD-10-CM | POA: Diagnosis not present

## 2024-05-04 DIAGNOSIS — D631 Anemia in chronic kidney disease: Secondary | ICD-10-CM | POA: Insufficient documentation

## 2024-05-04 LAB — IRON AND TIBC
Iron: 26 ug/dL — ABNORMAL LOW (ref 45–182)
Saturation Ratios: 7 % — ABNORMAL LOW (ref 17.9–39.5)
TIBC: 391 ug/dL (ref 250–450)
UIBC: 365 ug/dL

## 2024-05-04 LAB — LACTATE DEHYDROGENASE: LDH: 130 U/L (ref 98–192)

## 2024-05-04 LAB — CBC (CANCER CENTER ONLY)
HCT: 29.6 % — ABNORMAL LOW (ref 39.0–52.0)
Hemoglobin: 8.8 g/dL — ABNORMAL LOW (ref 13.0–17.0)
MCH: 22.9 pg — ABNORMAL LOW (ref 26.0–34.0)
MCHC: 29.7 g/dL — ABNORMAL LOW (ref 30.0–36.0)
MCV: 76.9 fL — ABNORMAL LOW (ref 80.0–100.0)
Platelet Count: 130 K/uL — ABNORMAL LOW (ref 150–400)
RBC: 3.85 MIL/uL — ABNORMAL LOW (ref 4.22–5.81)
RDW: 17.4 % — ABNORMAL HIGH (ref 11.5–15.5)
WBC Count: 3.4 K/uL — ABNORMAL LOW (ref 4.0–10.5)
nRBC: 0 % (ref 0.0–0.2)

## 2024-05-04 LAB — FERRITIN: Ferritin: 15 ng/mL — ABNORMAL LOW (ref 24–336)

## 2024-05-04 LAB — VITAMIN B12: Vitamin B-12: 285 pg/mL (ref 180–914)

## 2024-05-04 LAB — FOLATE: Folate: 14 ng/mL (ref >5.9)

## 2024-05-04 NOTE — Progress Notes (Unsigned)
 [facility]  [phone] [phone]  ID: Alan Barrett OB: 1928-11-24  MR#: [id number]  RDW#:[id number]  Patient Care Team: Rudolpho Norleen BIRCH, MD as PCP - General (Internal Medicine) Darliss Rogue, MD as PCP - Cardiology (Cardiology)  CHIEF COMPLAINT: Anemia secondary to chronic renal insufficiency and iron deficiency.  INTERVAL HISTORY: Patient is a 89 year old male who was noted to have declining hemoglobin on routine blood work.  He is referred for further evaluation and consideration of Retacrit .  He has chronic fatigue, but otherwise feels well.  He has no neurological plaints.  He denies any recent fevers or illnesses.  He has a good appetite and denies weight loss.  He has no chest pain, shortness of breath, cough, or hemoptysis.  He denies any nausea, vomiting, constipation, or diarrhea.  He has no melena or hematochezia.  He has no urinary complaints.  Patient offers no further specific complaints today.  REVIEW OF SYSTEMS:   Review of Systems  Constitutional: Negative.  Negative for fever, malaise/fatigue and weight loss.  Respiratory: Negative.  Negative for cough, hemoptysis and shortness of breath.   Cardiovascular: Negative.  Negative for chest pain and leg swelling.  Gastrointestinal: Negative.  Negative for abdominal pain, blood in stool and melena.  Genitourinary: Negative.  Negative for dysuria.  Musculoskeletal: Negative.  Negative for back pain.  Skin: Negative.  Negative for rash.  Neurological: Negative.  Negative for dizziness, focal weakness, weakness and headaches.  Psychiatric/Behavioral: Negative.  The patient is not nervous/anxious.     As per HPI. Otherwise, a complete review of systems is negative.  PAST MEDICAL HISTORY: Past Medical History:  Diagnosis Date   Atrial fibrillation (HCC)    Cancer (HCC)    CHF (congestive heart failure) (HCC)    COPD (chronic obstructive pulmonary disease) (HCC)    Coronary  artery disease    Diabetes mellitus without complication (HCC)    GERD (gastroesophageal reflux disease)    Hyperlipemia    Hypertension     PAST SURGICAL HISTORY: Past Surgical History:  Procedure Laterality Date   PROSTATE SURGERY     STENT PLACEMENT VASCULAR ([facility] HX)     TONSILLECTOMY      FAMILY HISTORY: Family History  Problem Relation Age of Onset   Heart disease Sister    Heart disease Brother     ADVANCED DIRECTIVES (Y/N):  N  HEALTH MAINTENANCE: Social History   Tobacco Use   Smoking status: Former   Smokeless tobacco: Never  Advertising account planner   Vaping status: Never Used  Substance Use Topics   Alcohol use: Never   Drug use: Never     Colonoscopy:  PAP:  Bone density:  Lipid panel:  Allergies  Allergen Reactions   Niacin Dermatitis   Niacin Er (Antihyperlipidemic) Rash    Current Outpatient Medications  Medication Sig Dispense Refill   atorvastatin  (LIPITOR) 40 MG tablet Take 40 mg by mouth daily.     chlorpheniramine-HYDROcodone (TUSSIONEX) 10-8 MG/5ML SUER Take 5 mLs by mouth every 12 (twelve) hours as needed for cough. 140 mL 0   guaiFENesin  (MUCINEX ) 600 MG 12 hr tablet Take 1 tablet (600 mg total) by mouth 2 (two) times daily. 20 tablet 0   ipratropium-albuterol  (DUONEB) 0.5-2.5 (3) MG/3ML SOLN Take 3 mLs by nebulization 4 (four) times daily. 30 mL 1   latanoprost  (XALATAN ) 0.005 % ophthalmic solution Place 1 drop into both eyes at bedtime.     losartan (COZAAR) 25 MG tablet Take 25  mg by mouth.     magnesium  oxide (MAG-OX) 400 (240 Mg) MG tablet Take 400 mg by mouth daily.     metolazone  (ZAROXOLYN ) 5 MG tablet Take 1 tablet (5 mg total) by mouth daily as needed (FOR WEIGHT GAIN). 4 tablet 3   omeprazole (PRILOSEC) 20 MG capsule Take 20 mg by mouth daily.     potassium chloride  (KLOR-CON ) 10 MEQ tablet Take 1 tablet (10 mEq total) by mouth daily. 90 tablet 3   PROAIR  HFA 108 (90 Base) MCG/ACT inhaler Take 2 puffs by mouth every 4 (four) hours as  needed.     tamsulosin  (FLOMAX ) 0.4 MG CAPS capsule Take 1 capsule (0.4 mg total) by mouth daily. 30 capsule 6   torsemide  (DEMADEX ) 20 MG tablet Take 2 tablets (40 mg total) by mouth daily. 180 tablet 3   traZODone  (DESYREL ) 50 MG tablet Take 50 mg by mouth at bedtime.     warfarin (COUMADIN ) 5 MG tablet Take 5 mg by mouth every evening. Pt is currently on coumadin  6 mg Th and 4 mg all other days.     No current facility-administered medications for this visit.    OBJECTIVE: Vitals:   05/04/24 1402 05/04/24 1406  BP: (!) 161/81 (!) 150/72  Pulse: 80   Resp: 18   Temp: 97.9 F (36.6 C)   SpO2: 100%      Body mass index is 27.22 kg/m.    ECOG FS:0 - Asymptomatic  General: Well-developed, well-nourished, no acute distress.  Sitting in a wheelchair. Eyes: Pink conjunctiva, anicteric sclera. HEENT: Normocephalic, moist mucous membranes. Lungs: No audible wheezing or coughing. Heart: Regular rate and rhythm. Abdomen: Soft, nontender, no obvious distention. Musculoskeletal: No edema, cyanosis, or clubbing. Neuro: Alert, answering all questions appropriately. Cranial nerves grossly intact. Skin: No rashes or petechiae noted. Psych: Normal affect. Lymphatics: No cervical, calvicular, axillary or inguinal LAD.   LAB RESULTS:  Lab Results  Component Value Date   NA 139 01/13/2024   K 3.4 (L) 01/13/2024   CL 97 01/13/2024   CO2 26 01/13/2024   GLUCOSE 242 (H) 01/13/2024   BUN 38 (H) 01/13/2024   CREATININE 1.67 (H) 01/13/2024   CALCIUM  8.8 01/13/2024   PROT 7.6 05/20/2016   ALBUMIN 3.0 (L) 05/20/2016   AST 43 (H) 05/20/2016   ALT 55 05/20/2016   ALKPHOS 85 05/20/2016   BILITOT 0.6 05/20/2016   GFRNONAA 44 (L) 12/09/2023   GFRAA >60 05/23/2016    Lab Results  Component Value Date   WBC 3.4 (L) 05/04/2024   NEUTROABS 2.1 08/30/2023   HGB 8.8 (L) 05/04/2024   HCT 29.6 (L) 05/04/2024   MCV 76.9 (L) 05/04/2024   PLT 130 (L) 05/04/2024   Lab Results  Component Value  Date   IRON 26 (L) 05/04/2024   TIBC 391 05/04/2024   IRONPCTSAT 7 (L) 05/04/2024   Lab Results  Component Value Date   FERRITIN 15 (L) 05/04/2024     STUDIES: No results found.  ASSESSMENT: Anemia secondary to chronic renal insufficiency and iron deficiency.  PLAN:    Anemia secondary to chronic renal insufficiency: Patient's hemoglobin noted to be 8.8 and will benefit from 40,000 units of Retacrit  in the future.  He has an injection scheduled later this week, but will also give 4 doses of IV Venofer over the next 2 weeks to replace his iron stores.  Patient will then return to clinic monthly for laboratory work and continuation of Retacrit  if his hemoglobin remains  below 10.0.  He will then return to clinic in 4 months with repeat laboratory work, further evaluation, and continuation of treatment if needed. Iron deficiency: 200 mg Venofer x 4 as above. Renal insufficiency: Continue monitoring and treatment per nephrology.  I spent a total of 45 minutes reviewing chart data, face-to-face evaluation with the patient, counseling and coordination of care as detailed above.  Patient expressed understanding and was in agreement with this plan. He also understands that He can call clinic at any time with any questions, concerns, or complaints.    Evalene JINNY Reusing, MD   05/05/2024 9:18 AM

## 2024-05-04 NOTE — Progress Notes (Unsigned)
 Patient has been on iron supplements before and he did have some diarrhea. Hasn't been taking the iron pill for about a year or two. He was diagnosed with congestive heart failure, and kidney failure this January, which his medications have been adjusted recently. Had some fluid on him.  His BP has been high lately. He has been thirsty, drinking a lot of tea.

## 2024-05-05 ENCOUNTER — Telehealth: Payer: Self-pay

## 2024-05-05 ENCOUNTER — Other Ambulatory Visit: Payer: Self-pay | Admitting: Oncology

## 2024-05-05 ENCOUNTER — Encounter: Payer: Self-pay | Admitting: Oncology

## 2024-05-05 ENCOUNTER — Inpatient Hospital Stay

## 2024-05-05 VITALS — BP 134/55

## 2024-05-05 DIAGNOSIS — D631 Anemia in chronic kidney disease: Secondary | ICD-10-CM | POA: Insufficient documentation

## 2024-05-05 DIAGNOSIS — I130 Hypertensive heart and chronic kidney disease with heart failure and stage 1 through stage 4 chronic kidney disease, or unspecified chronic kidney disease: Secondary | ICD-10-CM | POA: Diagnosis not present

## 2024-05-05 LAB — HAPTOGLOBIN: Haptoglobin: 181 mg/dL (ref 38–329)

## 2024-05-05 MED ORDER — EPOETIN ALFA-EPBX 40000 UNIT/ML IJ SOLN
40000.0000 [IU] | Freq: Once | INTRAMUSCULAR | Status: AC
  Administered 2024-05-05: 40000 [IU] via SUBCUTANEOUS
  Filled 2024-05-05: qty 1

## 2024-05-05 NOTE — Telephone Encounter (Signed)
 Patient's daughter would like to know if it is advised/safe for Alan Barrett to receive COVID vaccine.

## 2024-05-05 NOTE — Telephone Encounter (Signed)
 Attempted to contact Beth with MD response but no answer and unable to leave voicemail due to voicemail box being full.

## 2024-05-11 ENCOUNTER — Inpatient Hospital Stay

## 2024-05-11 VITALS — BP 130/55 | HR 74 | Temp 97.0°F | Resp 17

## 2024-05-11 DIAGNOSIS — I130 Hypertensive heart and chronic kidney disease with heart failure and stage 1 through stage 4 chronic kidney disease, or unspecified chronic kidney disease: Secondary | ICD-10-CM | POA: Diagnosis not present

## 2024-05-11 DIAGNOSIS — D631 Anemia in chronic kidney disease: Secondary | ICD-10-CM

## 2024-05-11 MED ORDER — SODIUM CHLORIDE 0.9% FLUSH
10.0000 mL | Freq: Once | INTRAVENOUS | Status: AC | PRN
  Administered 2024-05-11: 10 mL
  Filled 2024-05-11: qty 10

## 2024-05-11 MED ORDER — IRON SUCROSE 20 MG/ML IV SOLN
200.0000 mg | Freq: Once | INTRAVENOUS | Status: AC
  Administered 2024-05-11: 200 mg via INTRAVENOUS
  Filled 2024-05-11: qty 10

## 2024-05-11 NOTE — Progress Notes (Signed)
 Patient tolerated Venofer infusion well, no questions/concerns voiced. Monitored 30 min post transfusion. Patient stable at discharge. VSS. AVS given.

## 2024-05-11 NOTE — Patient Instructions (Signed)

## 2024-05-13 ENCOUNTER — Inpatient Hospital Stay

## 2024-05-13 ENCOUNTER — Telehealth: Payer: Self-pay

## 2024-05-13 VITALS — BP 106/47 | HR 69 | Temp 96.8°F | Resp 19

## 2024-05-13 DIAGNOSIS — I130 Hypertensive heart and chronic kidney disease with heart failure and stage 1 through stage 4 chronic kidney disease, or unspecified chronic kidney disease: Secondary | ICD-10-CM | POA: Diagnosis not present

## 2024-05-13 DIAGNOSIS — D631 Anemia in chronic kidney disease: Secondary | ICD-10-CM

## 2024-05-13 MED ORDER — IRON SUCROSE 20 MG/ML IV SOLN
200.0000 mg | Freq: Once | INTRAVENOUS | Status: AC
  Administered 2024-05-13: 200 mg via INTRAVENOUS
  Filled 2024-05-13: qty 10

## 2024-05-13 MED ORDER — TAMSULOSIN HCL 0.4 MG PO CAPS: 0.4000 mg | ORAL_CAPSULE | Freq: Every day | ORAL | 3 refills | Status: DC

## 2024-05-13 MED ORDER — SODIUM CHLORIDE 0.9% FLUSH
10.0000 mL | Freq: Once | INTRAVENOUS | Status: AC | PRN
  Administered 2024-05-13: 10 mL
  Filled 2024-05-13: qty 10

## 2024-05-13 MED ORDER — TORSEMIDE 20 MG PO TABS: 40.0000 mg | ORAL_TABLET | Freq: Every day | ORAL | 3 refills | Status: DC

## 2024-05-13 MED ORDER — ACETAMINOPHEN 325 MG PO TABS
650.0000 mg | ORAL_TABLET | Freq: Once | ORAL | Status: AC
  Administered 2024-05-13: 650 mg via ORAL
  Filled 2024-05-13: qty 2

## 2024-05-13 NOTE — Progress Notes (Signed)
 Pt's wife called stating pt needs refills on Torsemide  and Tamsulosin  sent to [facility].

## 2024-05-17 ENCOUNTER — Inpatient Hospital Stay

## 2024-05-17 VITALS — BP 128/59 | HR 65 | Temp 97.8°F | Resp 18

## 2024-05-17 DIAGNOSIS — D631 Anemia in chronic kidney disease: Secondary | ICD-10-CM

## 2024-05-17 DIAGNOSIS — I130 Hypertensive heart and chronic kidney disease with heart failure and stage 1 through stage 4 chronic kidney disease, or unspecified chronic kidney disease: Secondary | ICD-10-CM | POA: Diagnosis not present

## 2024-05-17 MED ORDER — IRON SUCROSE 20 MG/ML IV SOLN
200.0000 mg | Freq: Once | INTRAVENOUS | Status: AC
  Administered 2024-05-17: 200 mg via INTRAVENOUS
  Filled 2024-05-17: qty 10

## 2024-05-17 NOTE — Patient Instructions (Signed)

## 2024-05-20 ENCOUNTER — Ambulatory Visit

## 2024-05-20 ENCOUNTER — Inpatient Hospital Stay

## 2024-05-20 VITALS — BP 131/68 | HR 61 | Temp 98.0°F | Resp 18

## 2024-05-20 DIAGNOSIS — N189 Chronic kidney disease, unspecified: Secondary | ICD-10-CM

## 2024-05-20 DIAGNOSIS — I130 Hypertensive heart and chronic kidney disease with heart failure and stage 1 through stage 4 chronic kidney disease, or unspecified chronic kidney disease: Secondary | ICD-10-CM | POA: Diagnosis not present

## 2024-05-20 MED ORDER — IRON SUCROSE 20 MG/ML IV SOLN
200.0000 mg | Freq: Once | INTRAVENOUS | Status: AC
  Administered 2024-05-20: 200 mg via INTRAVENOUS
  Filled 2024-05-20: qty 10

## 2024-05-20 NOTE — Patient Instructions (Signed)

## 2024-06-03 ENCOUNTER — Inpatient Hospital Stay: Attending: Oncology

## 2024-06-03 ENCOUNTER — Telehealth: Payer: Self-pay | Admitting: Cardiology

## 2024-06-03 ENCOUNTER — Inpatient Hospital Stay

## 2024-06-03 DIAGNOSIS — D631 Anemia in chronic kidney disease: Secondary | ICD-10-CM | POA: Diagnosis not present

## 2024-06-03 DIAGNOSIS — N1831 Chronic kidney disease, stage 3a: Secondary | ICD-10-CM | POA: Diagnosis not present

## 2024-06-03 DIAGNOSIS — Z79899 Other long term (current) drug therapy: Secondary | ICD-10-CM | POA: Insufficient documentation

## 2024-06-03 DIAGNOSIS — I130 Hypertensive heart and chronic kidney disease with heart failure and stage 1 through stage 4 chronic kidney disease, or unspecified chronic kidney disease: Secondary | ICD-10-CM | POA: Diagnosis present

## 2024-06-03 LAB — HEMOGLOBIN AND HEMATOCRIT (CANCER CENTER ONLY)
HCT: 36 % — ABNORMAL LOW (ref 39.0–52.0)
Hemoglobin: 11 g/dL — ABNORMAL LOW (ref 13.0–17.0)

## 2024-06-03 NOTE — Progress Notes (Signed)
 HGB 11.0 today,  no injection given

## 2024-06-03 NOTE — Telephone Encounter (Signed)
 Called to confirm/remind patient of their appointment at the [facility] on 06/04/24.   Appointment:   [] Confirmed  [x] Left mess   [] No answer/No voice mail  [] VM Full/unable to leave message  [] Phone not in service  Patient reminded to bring all medications and/or complete list.  Confirmed patient has transportation. Gave directions, instructed to utilize valet parking.

## 2024-06-04 ENCOUNTER — Ambulatory Visit: Attending: Cardiology | Admitting: Cardiology

## 2024-06-04 ENCOUNTER — Encounter: Payer: Self-pay | Admitting: Cardiology

## 2024-06-04 VITALS — BP 133/50 | HR 63 | Wt 177.4 lb

## 2024-06-04 DIAGNOSIS — J449 Chronic obstructive pulmonary disease, unspecified: Secondary | ICD-10-CM | POA: Insufficient documentation

## 2024-06-04 DIAGNOSIS — I5032 Chronic diastolic (congestive) heart failure: Secondary | ICD-10-CM

## 2024-06-04 DIAGNOSIS — Z7901 Long term (current) use of anticoagulants: Secondary | ICD-10-CM | POA: Insufficient documentation

## 2024-06-04 DIAGNOSIS — I5042 Chronic combined systolic (congestive) and diastolic (congestive) heart failure: Secondary | ICD-10-CM | POA: Insufficient documentation

## 2024-06-04 DIAGNOSIS — I2510 Atherosclerotic heart disease of native coronary artery without angina pectoris: Secondary | ICD-10-CM | POA: Insufficient documentation

## 2024-06-04 DIAGNOSIS — E1122 Type 2 diabetes mellitus with diabetic chronic kidney disease: Secondary | ICD-10-CM | POA: Diagnosis not present

## 2024-06-04 DIAGNOSIS — I130 Hypertensive heart and chronic kidney disease with heart failure and stage 1 through stage 4 chronic kidney disease, or unspecified chronic kidney disease: Secondary | ICD-10-CM | POA: Diagnosis not present

## 2024-06-04 DIAGNOSIS — R3915 Urgency of urination: Secondary | ICD-10-CM | POA: Diagnosis not present

## 2024-06-04 DIAGNOSIS — N1831 Chronic kidney disease, stage 3a: Secondary | ICD-10-CM | POA: Diagnosis not present

## 2024-06-04 DIAGNOSIS — R110 Nausea: Secondary | ICD-10-CM | POA: Insufficient documentation

## 2024-06-04 DIAGNOSIS — I4821 Permanent atrial fibrillation: Secondary | ICD-10-CM | POA: Diagnosis not present

## 2024-06-04 DIAGNOSIS — Z79899 Other long term (current) drug therapy: Secondary | ICD-10-CM | POA: Diagnosis not present

## 2024-06-04 MED ORDER — TORSEMIDE 20 MG PO TABS: 20.0000 mg | ORAL_TABLET | Freq: Every day | ORAL | 3 refills | Status: AC

## 2024-06-04 NOTE — Patient Instructions (Signed)
 Medication Changes:  DECREASE Torsemide  20mg  (1 tab) daily   Follow-Up in: Follow up with the [facility] in 4 months with Ellouise Class, FNP   Thank you for choosing [facility].    At the [facility], you and your health needs are our priority. We have a designated team specialized in the treatment of Heart Failure. This Care Team includes your primary Heart Failure Specialized Cardiologist (physician), Advanced Practice Providers (APPs- Physician Assistants and Nurse Practitioners), and Pharmacist who all work together to provide you with the care you need, when you need it.   You may see any of the following providers on your designated Care Team at your next follow up:  Dr. Toribio Fuel Dr. Ezra Shuck Dr. Ria Commander Dr. Morene Brownie Ellouise Class, FNP Jaun Bash, RPH-CPP  Please be sure to bring in all your medications bottles to every appointment.   Need to Contact Us :  If you have any questions or concerns before your next appointment please send us  a message through [place] or call our office at [phone].    TO LEAVE A MESSAGE FOR THE NURSE SELECT OPTION 2, PLEASE LEAVE A MESSAGE INCLUDING: YOUR NAME DATE OF BIRTH CALL BACK NUMBER REASON FOR CALL**this is important as we prioritize the call backs  YOU WILL RECEIVE A CALL BACK THE SAME DAY AS LONG AS YOU CALL BEFORE 4:00 PM

## 2024-06-04 NOTE — Progress Notes (Signed)
   ADVANCED HEART FAILURE FOLLOW UP CLINIC NOTE  Referring Physician: Rudolpho Norleen BIRCH, MD  Primary Care: Rudolpho Norleen BIRCH, MD Primary Cardiologist:  HPI: Alan Barrett is a 89 y.o. male with a PMH of with a PMH of COPD, T2DM, HTN, CAD, A-fib on warfarin, HFpEF who presents for follow up of chronic diastolic heart failure.         Patient recently seen in the emergency room 12/24 for shortness of breath and lower extremity swelling.  He was referred to general cardiology and found to have an echocardiogram with a mildly reduced ejection fraction of 40 to 45% as well as moderately dilated RV.  He had significant biatrial dilation as well as moderate to severe mitral regurgitation as well as tricuspid regurgitation.  He had been difficult to diurese so was referred to heart failure for further evaluation.      SUBJECTIVE: Patient overall reports that he is feeling well.  He denies any worsening lower extremity edema orthopnea.  He drinks a lot of sweet tea, but states that this is because he is usually thirsty.  He has not had significant weight gain in his lower extremities have not worsened.  He is able to get around and do the things he would like to do around the house.  Accompanied by family members today.   PMH, current medications, allergies, social history, and family history reviewed in epic.  PHYSICAL EXAM: Vitals:   06/04/24 1158  BP: (!) 133/50  Pulse: 63  SpO2: 97%    GENERAL: Appears stated age, hard of hearing PULM:  Normal work of breathing, clear to auscultation bilaterally. Respirations are unlabored.  CARDIAC:  JVP: Flat         Normal rate with regular rhythm. Systolic murmur.  trace LE edema. Warm and well perfused extremities. ABDOMEN: Soft, non-tender, non-distended. NEUROLOGIC: Patient is oriented x3 with no focal or lateralizing neurologic deficits.    DATA REVIEW   ECHO: 10/20/23: LVEF 40-45%, Grade I DD, mildly elevated RVSP, biatrial severe  dilation, moderate to severe MR with prolapse, moderate TR   CATH: None    ASSESSMENT & PLAN:  Chronic heart failure with midrange EF: Secondary to longstanding atrial fibrillation, hypertension, age.  Given CKD and lack of ischemic symptoms there is minimal benefit to coronary angiography.  NYHA class II after successful diuresis. - Further decrease in torsemide  to 20 mg daily given euvolemic, continue thirst -Instructed to increase to 40 mg for worsening symptoms -Stop scheduled metolazone , only use prn -40meq potassium with metolazone  -Poor SGLT2 candidate given age -Avoid MRA at this time given reduced GFR - Off supplemental potassium   CKD: Stage IIIa, relatively stable with diuretics - Changes as above, GFR 43 last visit   Urinary urgency: No pain with urination but some hesitancy. - Continue flomax    DM: Stop metformin  given nausea.    Permanent atrial fibrillation: Unlikely to ever return to sinus given history and atrial size.  - Continue warfarin  Follow-up in 6 months  Morene Brownie, MD Advanced Heart Failure Mechanical Circulatory Support 06/04/24

## 2024-07-05 ENCOUNTER — Inpatient Hospital Stay: Attending: Oncology

## 2024-07-05 ENCOUNTER — Inpatient Hospital Stay

## 2024-07-05 DIAGNOSIS — D631 Anemia in chronic kidney disease: Secondary | ICD-10-CM | POA: Diagnosis not present

## 2024-07-05 DIAGNOSIS — N189 Chronic kidney disease, unspecified: Secondary | ICD-10-CM

## 2024-07-05 DIAGNOSIS — Z79899 Other long term (current) drug therapy: Secondary | ICD-10-CM | POA: Diagnosis not present

## 2024-07-05 DIAGNOSIS — N1832 Chronic kidney disease, stage 3b: Secondary | ICD-10-CM | POA: Diagnosis not present

## 2024-07-05 DIAGNOSIS — I130 Hypertensive heart and chronic kidney disease with heart failure and stage 1 through stage 4 chronic kidney disease, or unspecified chronic kidney disease: Secondary | ICD-10-CM | POA: Diagnosis present

## 2024-07-05 LAB — HEMOGLOBIN AND HEMATOCRIT (CANCER CENTER ONLY)
HCT: 37.2 % — ABNORMAL LOW (ref 39.0–52.0)
Hemoglobin: 11.7 g/dL — ABNORMAL LOW (ref 13.0–17.0)

## 2024-07-05 NOTE — Progress Notes (Signed)
 Hemoglobin 11.7,  retacrit  injection held today, patient aware and sent home

## 2024-08-03 ENCOUNTER — Inpatient Hospital Stay

## 2024-08-03 ENCOUNTER — Inpatient Hospital Stay: Attending: Oncology

## 2024-08-03 DIAGNOSIS — D631 Anemia in chronic kidney disease: Secondary | ICD-10-CM | POA: Diagnosis not present

## 2024-08-03 DIAGNOSIS — N1832 Chronic kidney disease, stage 3b: Secondary | ICD-10-CM | POA: Diagnosis not present

## 2024-08-03 DIAGNOSIS — Z79899 Other long term (current) drug therapy: Secondary | ICD-10-CM | POA: Diagnosis not present

## 2024-08-03 DIAGNOSIS — I130 Hypertensive heart and chronic kidney disease with heart failure and stage 1 through stage 4 chronic kidney disease, or unspecified chronic kidney disease: Secondary | ICD-10-CM | POA: Diagnosis present

## 2024-08-03 LAB — HEMOGLOBIN AND HEMATOCRIT (CANCER CENTER ONLY)
HCT: 35.9 % — ABNORMAL LOW (ref 39.0–52.0)
Hemoglobin: 11.7 g/dL — ABNORMAL LOW (ref 13.0–17.0)

## 2024-08-03 NOTE — Progress Notes (Signed)
 Hgb at 11.7 today; per parameters hold injection if greater than 10.

## 2024-08-04 ENCOUNTER — Encounter: Payer: Self-pay | Admitting: Cardiology

## 2024-08-04 ENCOUNTER — Ambulatory Visit: Attending: Cardiology | Admitting: Cardiology

## 2024-08-04 VITALS — BP 126/71 | HR 71 | Ht 67.0 in | Wt 175.0 lb

## 2024-08-04 DIAGNOSIS — I10 Essential (primary) hypertension: Secondary | ICD-10-CM | POA: Diagnosis not present

## 2024-08-04 DIAGNOSIS — I5020 Unspecified systolic (congestive) heart failure: Secondary | ICD-10-CM | POA: Diagnosis not present

## 2024-08-04 DIAGNOSIS — I2510 Atherosclerotic heart disease of native coronary artery without angina pectoris: Secondary | ICD-10-CM | POA: Diagnosis not present

## 2024-08-04 DIAGNOSIS — I4891 Unspecified atrial fibrillation: Secondary | ICD-10-CM

## 2024-08-04 DIAGNOSIS — I340 Nonrheumatic mitral (valve) insufficiency: Secondary | ICD-10-CM

## 2024-08-04 NOTE — Patient Instructions (Signed)

## 2024-08-04 NOTE — Progress Notes (Signed)
 Cardiology Office Note:    Date:  08/04/2024   ID:  Alan Barrett, DOB [birth year], MRN [id number]  PCP:  Rudolpho Norleen BIRCH, MD   [facility] Providers Cardiologist:  Redell Cave, MD     Referring MD: Rudolpho Norleen BIRCH, MD   Chief Complaint  Patient presents with   Follow-up    6 month follow up pt has been doing well with no complaints of chest pain, chest pressure or SOB, medciation reviewed verbally with patient    History of Present Illness:    Alan Barrett is a 89 y.o. male with a hx of cardiomyopathy EF 40 to 45%, moderate to severe MR CAD/PCI (remote ?2010), permanent atrial fibrillation on Coumadin , hypertension, hyperlipidemia, COPD who presents for follow-up.  Doing okay, denies chest pain or shortness of breath, denies edema.  Compliant with current medications as prescribed, including torsemide  20 mg daily.  Has not needed to take metolazone .  Feels well, has no concerns at this time.   Prior notes/testing Repeat echocardiogram 10/2023 showed EF 40 to 45%, moderate to severe MR. Aldactone  stopped due to worsening renal function.  Toprol -XL stopped due to low BP Echocardiogram 2017 EF 50 to 55%  Past Medical History:  Diagnosis Date   Atrial fibrillation (HCC)    Cancer (HCC)    CHF (congestive heart failure) (HCC)    COPD (chronic obstructive pulmonary disease) (HCC)    Coronary artery disease    Diabetes mellitus without complication (HCC)    GERD (gastroesophageal reflux disease)    Hyperlipemia    Hypertension     Past Surgical History:  Procedure Laterality Date   PROSTATE SURGERY     STENT PLACEMENT VASCULAR ([facility] HX)     TONSILLECTOMY      Current Medications: Current Meds  Medication Sig   atorvastatin  (LIPITOR) 40 MG tablet Take 40 mg by mouth daily.   chlorpheniramine-HYDROcodone (TUSSIONEX) 10-8 MG/5ML SUER Take 5 mLs by mouth every 12 (twelve) hours as needed for cough.   guaiFENesin  (MUCINEX ) 600 MG 12 hr tablet Take  1 tablet (600 mg total) by mouth 2 (two) times daily.   ipratropium-albuterol  (DUONEB) 0.5-2.5 (3) MG/3ML SOLN Take 3 mLs by nebulization 4 (four) times daily.   latanoprost  (XALATAN ) 0.005 % ophthalmic solution Place 1 drop into both eyes at bedtime.   losartan (COZAAR) 25 MG tablet Take 25 mg by mouth.   magnesium  oxide (MAG-OX) 400 (240 Mg) MG tablet Take 400 mg by mouth daily.   metolazone  (ZAROXOLYN ) 5 MG tablet Take 1 tablet (5 mg total) by mouth daily as needed (FOR WEIGHT GAIN).   omeprazole (PRILOSEC) 20 MG capsule Take 20 mg by mouth daily.   PROAIR  HFA 108 (90 Base) MCG/ACT inhaler Take 2 puffs by mouth every 4 (four) hours as needed.   tamsulosin  (FLOMAX ) 0.4 MG CAPS capsule Take 1 capsule (0.4 mg total) by mouth daily.   torsemide  (DEMADEX ) 20 MG tablet Take 1 tablet (20 mg total) by mouth daily.   traZODone  (DESYREL ) 50 MG tablet Take 50 mg by mouth at bedtime.   warfarin (COUMADIN ) 5 MG tablet Take 5 mg by mouth every evening. Pt is currently on coumadin  6 mg Th and 4 mg all other days.     Allergies:   Niacin and Niacin er (antihyperlipidemic)   Social History   Socioeconomic History   Marital status: Married    Spouse name: Not on file   Number of children: Not on file  Years of education: Not on file   Highest education level: Not on file  Occupational History   Not on file  Tobacco Use   Smoking status: Former   Smokeless tobacco: Never  Vaping Use   Vaping status: Never Used  Substance and Sexual Activity   Alcohol use: Never   Drug use: Never   Sexual activity: Not on file  Other Topics Concern   Not on file  Social History Narrative   Not on file   Social Drivers of Health   Financial Resource Strain: Low Risk  (12/09/2023)   Received from [facility]   Overall Financial Resource Strain (CARDIA)    Difficulty of Paying Living Expenses: Not very hard  Food Insecurity: No Food Insecurity (05/04/2024)   Hunger Vital Sign    Worried  About Running Out of Food in the Last Year: Never true    Ran Out of Food in the Last Year: Never true  Transportation Needs: No Transportation Needs (05/04/2024)   PRAPARE - Administrator, Civil Service (Medical): No    Lack of Transportation (Non-Medical): No  Physical Activity: Not on file  Stress: Not on file  Social Connections: Not on file     Family History: The patient's family history includes Heart disease in his brother and sister.  ROS:   Please see the history of present illness.     All other systems reviewed and are negative.  EKGs/Labs/Other Studies Reviewed:    The following studies were reviewed today:  EKG Interpretation Date/Time:  Wednesday August 04 2024 10:11:15 EST Ventricular Rate:  71 PR Interval:    QRS Duration:  92 QT Interval:  418 QTC Calculation: 454 R Axis:   -64  Text Interpretation: Atrial fibrillation with premature ventricular or aberrantly conducted complexes Left anterior fascicular block Nonspecific T wave abnormality Confirmed by Darliss Rogue ([id number]) on 08/04/2024 10:17:25 AM    Recent Labs: 08/30/2023: B Natriuretic Peptide 1,206.1 01/13/2024: BUN 38; Creatinine, Ser 1.67; Potassium 3.4; Sodium 139 05/04/2024: Platelet Count 130 08/03/2024: Hemoglobin 11.7  Recent Lipid Panel No results found for: CHOL, TRIG, HDL, CHOLHDL, VLDL, LDLCALC, LDLDIRECT   Risk Assessment/Calculations:             Physical Exam:    VS:  BP 126/71 (BP Location: Left Arm, Patient Position: Sitting, Cuff Size: Normal)   Pulse 71   Ht 5' 7 (1.702 m)   Wt 175 lb (79.4 kg)   SpO2 94%   BMI 27.41 kg/m     Wt Readings from Last 3 Encounters:  08/04/24 175 lb (79.4 kg)  06/04/24 177 lb 6.4 oz (80.5 kg)  05/04/24 179 lb (81.2 kg)     GEN:  Well nourished, well developed in no acute distress HEENT: Normal NECK: No JVD; No carotid bruits CARDIAC: Irregular irregular RESPIRATORY: Diminished breath sounds  bilaterally ABDOMEN: Soft, non-tender, distended MUSCULOSKELETAL: no edema; No deformity  SKIN: Warm and dry NEUROLOGIC:  Alert and oriented x 3 PSYCHIATRIC:  Normal affect   ASSESSMENT:    1. HFrEF (heart failure with reduced ejection fraction) (HCC)   2. Atrial fibrillation, unspecified type (HCC)   3. Primary hypertension   4. Coronary artery disease involving native coronary artery of native heart, unspecified whether angina present   5. Mitral valve insufficiency, unspecified etiology    PLAN:    In order of problems listed above:  Cardiomyopathy EF 40-45%.  Appears euvolemic, minimal dyspnea, describes  NYHA class III heart  failure.  continue torsemide  20 mg daily.  Metolazone  as needed for edema/weight gain.  Evaluated by heart failure service, Aldactone  stopped due to renal dysfunction, no SGLT2 given age. Permanent A-fib, heart rate controlled.  Last INR therapeutic at 2.2.  Continue Coumadin . Hypertension, BP controlled.  Continue losartan 25 mg daily, torsemide   20 mg daily. History of CAD/PCI.  Continue warfarin, Lipitor 40 mg daily. Moderate to severe MR on echo.  Torsemide  as above.  Currently asymptomatic.  Likely not a surgical candidate due to age, comorbidities.     Follow-up in 12 months.     Medication Adjustments/Labs and Tests Ordered: Current medicines are reviewed at length with the patient today.  Concerns regarding medicines are outlined above.  Orders Placed This Encounter  Procedures   EKG 12-Lead   No orders of the defined types were placed in this encounter.   Patient Instructions  Medication Instructions:  Your physician recommends that you continue on your current medications as directed. Please refer to the Current Medication list given to you today.   *If you need a refill on your cardiac medications before your next appointment, please call your pharmacy*  Lab Work: No labs ordered today  If you have labs (blood work) drawn today and  your tests are completely normal, you will receive your results only by: MyChart Message (if you have MyChart) OR A paper copy in the mail If you have any lab test that is abnormal or we need to change your treatment, we will call you to review the results.  Testing/Procedures: No test ordered today   Follow-Up: At [facility], you and your health needs are our priority.  As part of our continuing mission to provide you with exceptional heart care, our providers are all part of one team.  This team includes your primary Cardiologist (physician) and Advanced Practice Providers or APPs (Physician Assistants and Nurse Practitioners) who all work together to provide you with the care you need, when you need it.  Your next appointment:   1 year(s)  Provider:   You may see Redell Cave, MD or one of the following Advanced Practice Providers on your designated Care Team:   Lonni Meager, NP Lesley Maffucci, PA-C Bernardino Bring, PA-C Cadence [place], PA-C Tylene Lunch, NP Barnie Hila, NP    We recommend signing up for the patient portal called MyChart.  Sign up information is provided on this After Visit Summary.  MyChart is used to connect with patients for Virtual Visits (Telemedicine).  Patients are able to view lab/test results, encounter notes, upcoming appointments, etc.  Non-urgent messages can be sent to your provider as well.   To learn more about what you can do with MyChart, go to [email or web address].             Signed, Redell Cave, MD  08/04/2024 12:51 PM    [facility]

## 2024-08-09 ENCOUNTER — Telehealth: Payer: Self-pay | Admitting: Cardiology

## 2024-08-09 MED ORDER — TAMSULOSIN HCL 0.4 MG PO CAPS: 0.4000 mg | ORAL_CAPSULE | Freq: Every day | ORAL | 3 refills | Status: AC

## 2024-08-09 NOTE — Telephone Encounter (Signed)
 Tamsulosin  refilled per patient request

## 2024-09-07 ENCOUNTER — Inpatient Hospital Stay: Attending: Oncology

## 2024-09-07 ENCOUNTER — Inpatient Hospital Stay (HOSPITAL_BASED_OUTPATIENT_CLINIC_OR_DEPARTMENT_OTHER): Admitting: Oncology

## 2024-09-07 ENCOUNTER — Inpatient Hospital Stay

## 2024-09-07 ENCOUNTER — Encounter: Payer: Self-pay | Admitting: Oncology

## 2024-09-07 VITALS — BP 124/64 | HR 72 | Temp 97.6°F | Resp 19 | Wt 174.0 lb

## 2024-09-07 DIAGNOSIS — D631 Anemia in chronic kidney disease: Secondary | ICD-10-CM

## 2024-09-07 DIAGNOSIS — N189 Chronic kidney disease, unspecified: Secondary | ICD-10-CM | POA: Diagnosis not present

## 2024-09-07 LAB — CBC WITH DIFFERENTIAL (CANCER CENTER ONLY)
Abs Immature Granulocytes: 0.04 K/uL (ref 0.00–0.07)
Basophils Absolute: 0 K/uL (ref 0.0–0.1)
Basophils Relative: 0 %
Eosinophils Absolute: 0.2 K/uL (ref 0.0–0.5)
Eosinophils Relative: 3 %
HCT: 36.8 % — ABNORMAL LOW (ref 39.0–52.0)
Hemoglobin: 12.3 g/dL — ABNORMAL LOW (ref 13.0–17.0)
Immature Granulocytes: 1 %
Lymphocytes Relative: 30 %
Lymphs Abs: 1.6 K/uL (ref 0.7–4.0)
MCH: 29.4 pg (ref 26.0–34.0)
MCHC: 33.4 g/dL (ref 30.0–36.0)
MCV: 87.8 fL (ref 80.0–100.0)
Monocytes Absolute: 0.3 K/uL (ref 0.1–1.0)
Monocytes Relative: 6 %
Neutro Abs: 3.1 K/uL (ref 1.7–7.7)
Neutrophils Relative %: 60 %
Platelet Count: 193 K/uL (ref 150–400)
RBC: 4.19 MIL/uL — ABNORMAL LOW (ref 4.22–5.81)
RDW: 15.5 % (ref 11.5–15.5)
WBC Count: 5.2 K/uL (ref 4.0–10.5)
nRBC: 0 % (ref 0.0–0.2)

## 2024-09-07 LAB — IRON AND TIBC
Iron: 71 ug/dL (ref 45–182)
Saturation Ratios: 23 % (ref 17.9–39.5)
TIBC: 311 ug/dL (ref 250–450)
UIBC: 240 ug/dL

## 2024-09-07 LAB — FERRITIN: Ferritin: 53 ng/mL (ref 24–336)

## 2024-09-07 NOTE — Progress Notes (Signed)
 Hgb 12.3 today, no inj given

## 2024-09-07 NOTE — Progress Notes (Signed)
 " [facility]  [phone] Fax:([phone]) [phone]  ID: Alan Barrett Vicie Mickey OB: 05/04/1929  MR#: [id number]  RDW#:[id number]  Patient Care Team: Rudolpho Norleen BIRCH, MD as PCP - General (Internal Medicine) Darliss Rogue, MD as PCP - Cardiology (Cardiology) Jacobo Evalene PARAS, MD as Consulting Physician (Oncology)  CHIEF COMPLAINT: Anemia secondary to chronic renal insufficiency and iron  deficiency.  INTERVAL HISTORY: Patient returns to clinic today for repeat laboratory, further evaluation, and consideration of additional IV Venofer  or Retacrit .  He continues to have chronic fatigue, but otherwise feels well.  He has no neurologic complaints.  He denies any recent fevers or illnesses.  He has a good appetite and denies weight loss.  He has no chest pain, shortness of breath, cough, or hemoptysis.  He denies any nausea, vomiting, constipation, or diarrhea.  He has no melena or hematochezia.  He has no urinary complaints.  Patient offers no further specific complaints today.  REVIEW OF SYSTEMS:   Review of Systems  Constitutional:  Positive for malaise/fatigue. Negative for fever and weight loss.  Respiratory: Negative.  Negative for cough, hemoptysis and shortness of breath.   Cardiovascular: Negative.  Negative for chest pain and leg swelling.  Gastrointestinal: Negative.  Negative for abdominal pain, blood in stool and melena.  Genitourinary: Negative.  Negative for dysuria.  Musculoskeletal: Negative.  Negative for back pain.  Skin: Negative.  Negative for rash.  Neurological: Negative.  Negative for dizziness, focal weakness, weakness and headaches.  Psychiatric/Behavioral: Negative.  The patient is not nervous/anxious.     As per HPI. Otherwise, a complete review of systems is negative.  PAST MEDICAL HISTORY: Past Medical History:  Diagnosis Date   Atrial fibrillation (HCC)    Cancer (HCC)    CHF (congestive heart failure) (HCC)    COPD (chronic  obstructive pulmonary disease) (HCC)    Coronary artery disease    Diabetes mellitus without complication (HCC)    GERD (gastroesophageal reflux disease)    Hyperlipemia    Hypertension     PAST SURGICAL HISTORY: Past Surgical History:  Procedure Laterality Date   PROSTATE SURGERY     STENT PLACEMENT VASCULAR ([facility] HX)     TONSILLECTOMY      FAMILY HISTORY: Family History  Problem Relation Age of Onset   Heart disease Sister    Heart disease Brother     ADVANCED DIRECTIVES (Y/N):  N  HEALTH MAINTENANCE: Social History   Tobacco Use   Smoking status: Former   Smokeless tobacco: Never  Advertising Account Planner   Vaping status: Never Used  Substance Use Topics   Alcohol use: Never   Drug use: Never     Colonoscopy:  PAP:  Bone density:  Lipid panel:  Allergies  Allergen Reactions   Niacin Dermatitis   Niacin Er (Antihyperlipidemic) Rash    Current Outpatient Medications  Medication Sig Dispense Refill   atorvastatin  (LIPITOR) 40 MG tablet Take 40 mg by mouth daily.     chlorpheniramine-HYDROcodone (TUSSIONEX) 10-8 MG/5ML SUER Take 5 mLs by mouth every 12 (twelve) hours as needed for cough. 140 mL 0   guaiFENesin  (MUCINEX ) 600 MG 12 hr tablet Take 1 tablet (600 mg total) by mouth 2 (two) times daily. 20 tablet 0   ipratropium-albuterol  (DUONEB) 0.5-2.5 (3) MG/3ML SOLN Take 3 mLs by nebulization 4 (four) times daily. 30 mL 1   latanoprost  (XALATAN ) 0.005 % ophthalmic solution Place 1 drop into both eyes at bedtime.     losartan (COZAAR) 25 MG  tablet Take 25 mg by mouth.     magnesium  oxide (MAG-OX) 400 (240 Mg) MG tablet Take 400 mg by mouth daily.     metolazone  (ZAROXOLYN ) 5 MG tablet Take 1 tablet (5 mg total) by mouth daily as needed (FOR WEIGHT GAIN). 4 tablet 3   omeprazole (PRILOSEC) 20 MG capsule Take 20 mg by mouth daily.     PROAIR  HFA 108 (90 Base) MCG/ACT inhaler Take 2 puffs by mouth every 4 (four) hours as needed.     tamsulosin  (FLOMAX ) 0.4 MG CAPS capsule  Take 1 capsule (0.4 mg total) by mouth daily. 90 capsule 3   torsemide  (DEMADEX ) 20 MG tablet Take 1 tablet (20 mg total) by mouth daily. 90 tablet 3   traZODone  (DESYREL ) 50 MG tablet Take 50 mg by mouth at bedtime.     warfarin (COUMADIN ) 5 MG tablet Take 5 mg by mouth every evening. Pt is currently on coumadin  6 mg Th and 4 mg all other days.     No current facility-administered medications for this visit.    OBJECTIVE: Vitals:   09/07/24 1103  BP: 124/64  Pulse: 72  Resp: 19  Temp: 97.6 F (36.4 C)  SpO2: 98%     Body mass index is 27.25 kg/m.    ECOG FS:0 - Asymptomatic  General: Well-developed, well-nourished, no acute distress.  Sitting in a wheelchair. Eyes: Pink conjunctiva, anicteric sclera. HEENT: Normocephalic, moist mucous membranes. Lungs: No audible wheezing or coughing. Heart: Regular rate and rhythm. Abdomen: Soft, nontender, no obvious distention. Musculoskeletal: No edema, cyanosis, or clubbing. Neuro: Alert, answering all questions appropriately. Cranial nerves grossly intact. Skin: No rashes or petechiae noted. Psych: Normal affect.  LAB RESULTS:  Lab Results  Component Value Date   NA 139 01/13/2024   K 3.4 (L) 01/13/2024   CL 97 01/13/2024   CO2 26 01/13/2024   GLUCOSE 242 (H) 01/13/2024   BUN 38 (H) 01/13/2024   CREATININE 1.67 (H) 01/13/2024   CALCIUM  8.8 01/13/2024   PROT 7.6 05/20/2016   ALBUMIN 3.0 (L) 05/20/2016   AST 43 (H) 05/20/2016   ALT 55 05/20/2016   ALKPHOS 85 05/20/2016   BILITOT 0.6 05/20/2016   GFRNONAA 44 (L) 12/09/2023   GFRAA >60 05/23/2016    Lab Results  Component Value Date   WBC 5.2 09/07/2024   NEUTROABS 3.1 09/07/2024   HGB 12.3 (L) 09/07/2024   HCT 36.8 (L) 09/07/2024   MCV 87.8 09/07/2024   PLT 193 09/07/2024   Lab Results  Component Value Date   IRON  71 09/07/2024   TIBC 311 09/07/2024   IRONPCTSAT 23 09/07/2024   Lab Results  Component Value Date   FERRITIN 53 09/07/2024     STUDIES: No  results found.  ASSESSMENT: Anemia secondary to chronic renal insufficiency and iron  deficiency.  PLAN:    Iron  deficiency anemia: Essentially resolved.  Patient's hemoglobin has improved to 12.3 and his iron  stores are now within normal limits.  He does not require additional IV Venofer  today.  He last received treatment on May 20, 2024.  No intervention is needed.  Return to clinic in 4 months with repeat laboratory, further evaluation, and consideration of additional treatment if needed.   Anemia secondary to chronic renal insufficiency: Hemoglobin 12.3 as above, therefore he does not require additional Retacrit .  Goal hemoglobin is greater than 10.0.  Patient last received 40,000 units of Retacrit  on May 05, 2024.  Return to clinic in 4 months as above.  Renal insufficiency: Chronic and unchanged.  Continue monitoring and treatment per nephrology.   Patient expressed understanding and was in agreement with this plan. He also understands that He can call clinic at any time with any questions, concerns, or complaints.    Evalene JINNY Reusing, MD   09/07/2024 12:01 PM     "

## 2024-10-12 ENCOUNTER — Encounter: Admitting: Family

## 2024-12-27 ENCOUNTER — Inpatient Hospital Stay

## 2024-12-28 ENCOUNTER — Inpatient Hospital Stay

## 2024-12-28 ENCOUNTER — Inpatient Hospital Stay: Admitting: Oncology
